# Patient Record
Sex: Female | Born: 1995 | Hispanic: Yes | Smoking: Never smoker
Health system: Southern US, Community
[De-identification: ages and names within clinical notes are randomized; demographics above are authoritative.]

## PROBLEM LIST (undated history)

## (undated) ENCOUNTER — Inpatient Hospital Stay (HOSPITAL_COMMUNITY): Payer: Self-pay

## (undated) DIAGNOSIS — O139 Gestational [pregnancy-induced] hypertension without significant proteinuria, unspecified trimester: Secondary | ICD-10-CM

## (undated) DIAGNOSIS — E7800 Pure hypercholesterolemia, unspecified: Secondary | ICD-10-CM

## (undated) DIAGNOSIS — N390 Urinary tract infection, site not specified: Secondary | ICD-10-CM

---

## 2020-05-29 ENCOUNTER — Other Ambulatory Visit: Payer: Self-pay

## 2020-05-29 ENCOUNTER — Inpatient Hospital Stay (HOSPITAL_COMMUNITY)
Admission: EM | Admit: 2020-05-29 | Discharge: 2020-05-30 | Disposition: A | Discharge status: Home / Self Care | Payer: Medicaid Other | Attending: Emergency Medicine | Admitting: Emergency Medicine

## 2020-05-29 DIAGNOSIS — O3680X0 Pregnancy with inconclusive fetal viability, not applicable or unspecified: Secondary | ICD-10-CM

## 2020-05-29 DIAGNOSIS — R109 Unspecified abdominal pain: Secondary | ICD-10-CM | POA: Diagnosis not present

## 2020-05-29 DIAGNOSIS — O26899 Other specified pregnancy related conditions, unspecified trimester: Secondary | ICD-10-CM

## 2020-05-29 DIAGNOSIS — O26891 Other specified pregnancy related conditions, first trimester: Secondary | ICD-10-CM | POA: Insufficient documentation

## 2020-05-29 DIAGNOSIS — Z6790 Unspecified blood type, Rh positive: Secondary | ICD-10-CM

## 2020-05-29 DIAGNOSIS — Z3A01 Less than 8 weeks gestation of pregnancy: Secondary | ICD-10-CM | POA: Insufficient documentation

## 2020-05-29 HISTORY — DX: Pure hypercholesterolemia, unspecified: E78.00

## 2020-05-29 HISTORY — DX: Gestational (pregnancy-induced) hypertension without significant proteinuria, unspecified trimester: O13.9

## 2020-05-29 HISTORY — DX: Urinary tract infection, site not specified: N39.0

## 2020-05-29 LAB — I-STAT BETA HCG BLOOD, ED (MC, WL, AP ONLY): I-stat hCG, quantitative: 613.3 m[IU]/mL — ABNORMAL HIGH (ref <5)

## 2020-05-29 NOTE — ED Provider Notes (Signed)
MSE was initiated and I personally evaluated the patient and placed orders (if any) at  10:33 PM on May 29, 2020.  The patient appears stable so that the remainder of the MSE may be completed by another provider.  G3P2 with positive home preg test here with 2 days worsening low abd pain. No bleeding. IStat HCG here 600s. Discussed with Joni Reining at [facility] who accepts patient in transfer. [facility]   Terrilee Files, MD 05/29/20 [phone]

## 2020-05-29 NOTE — MAU Note (Signed)
Pt c/o lower abdominal pain on and off since yesterday, some nausea with the pain. No bleeding.

## 2020-05-29 NOTE — ED Triage Notes (Signed)
Pt presents to ED POV. Pt c/o lower abd cramping. Pt taken home preg test was positive. Pt reports that LMP - 05/01/2020.

## 2020-05-30 ENCOUNTER — Inpatient Hospital Stay (HOSPITAL_COMMUNITY): Payer: Medicaid Other

## 2020-05-30 DIAGNOSIS — O3680X0 Pregnancy with inconclusive fetal viability, not applicable or unspecified: Secondary | ICD-10-CM

## 2020-05-30 LAB — COMPREHENSIVE METABOLIC PANEL
ALT: 16 U/L (ref 0–44)
AST: 18 U/L (ref 15–41)
Albumin: 4.1 g/dL (ref 3.5–5.0)
Alkaline Phosphatase: 66 U/L (ref 38–126)
Anion gap: 10 (ref 5–15)
BUN: 11 mg/dL (ref 6–20)
CO2: 22 mmol/L (ref 22–32)
Calcium: 9.2 mg/dL (ref 8.9–10.3)
Chloride: 106 mmol/L (ref 98–111)
Creatinine, Ser: 0.89 mg/dL (ref 0.44–1.00)
GFR, Estimated: >60 mL/min (ref >60)
Glucose, Bld: 102 mg/dL — ABNORMAL HIGH (ref 70–99)
Potassium: 3.5 mmol/L (ref 3.5–5.1)
Sodium: 138 mmol/L (ref 135–145)
Total Bilirubin: 1.1 mg/dL (ref 0.3–1.2)
Total Protein: 7.2 g/dL (ref 6.5–8.1)

## 2020-05-30 LAB — WET PREP, GENITAL
Clue Cells Wet Prep HPF POC: NONE SEEN
Sperm: NONE SEEN
Trich, Wet Prep: NONE SEEN
Yeast Wet Prep HPF POC: NONE SEEN

## 2020-05-30 LAB — CBC
HCT: 37.9 % (ref 36.0–46.0)
Hemoglobin: 13.7 g/dL (ref 12.0–15.0)
MCH: 32.5 pg (ref 26.0–34.0)
MCHC: 36.1 g/dL — ABNORMAL HIGH (ref 30.0–36.0)
MCV: 89.8 fL (ref 80.0–100.0)
Platelets: 249 10*3/uL (ref 150–400)
RBC: 4.22 MIL/uL (ref 3.87–5.11)
RDW: 12.6 % (ref 11.5–15.5)
WBC: 10.7 10*3/uL — ABNORMAL HIGH (ref 4.0–10.5)
nRBC: 0 % (ref 0.0–0.2)

## 2020-05-30 LAB — ABO/RH: ABO/RH(D): O POS

## 2020-05-30 LAB — URINALYSIS, ROUTINE W REFLEX MICROSCOPIC
Bilirubin Urine: NEGATIVE
Glucose, UA: NEGATIVE mg/dL
Hgb urine dipstick: NEGATIVE
Ketones, ur: 5 mg/dL — AB
Leukocytes,Ua: NEGATIVE
Nitrite: NEGATIVE
Protein, ur: NEGATIVE mg/dL
Specific Gravity, Urine: 1.031 — ABNORMAL HIGH (ref 1.005–1.030)
pH: 5 (ref 5.0–8.0)

## 2020-05-30 LAB — HCG, QUANTITATIVE, PREGNANCY: hCG, Beta Chain, Quant, S: 732 m[IU]/mL — ABNORMAL HIGH (ref <5)

## 2020-05-30 NOTE — Discharge Instructions (Signed)
Safe Medications in Pregnancy    Acne: Benzoyl Peroxide Salicylic Acid  Backache/Headache: Tylenol: 2 regular strength every 4 hours OR              2 Extra strength every 6 hours  Colds/Coughs/Allergies: Benadryl (alcohol free) 25 mg every 6 hours as needed Breath right strips Claritin Cepacol throat lozenges Chloraseptic throat spray Cold-Eeze- up to three times per day Cough drops, alcohol free Flonase (by prescription only) Guaifenesin Mucinex Robitussin DM (plain only, alcohol free) Saline nasal spray/drops Sudafed (pseudoephedrine) & Actifed ** use only after [redacted] weeks gestation and if you do not have high blood pressure Tylenol Vicks Vaporub Zinc lozenges Zyrtec   Constipation: Colace Ducolax suppositories Fleet enema Glycerin suppositories Metamucil Milk of magnesia Miralax Senokot Smooth move tea  Diarrhea: Kaopectate Imodium A-D  *NO pepto Bismol  Hemorrhoids: Anusol Anusol HC Preparation H Tucks  Indigestion: Tums Maalox Mylanta Zantac  Pepcid  Insomnia: Benadryl (alcohol free)  every 6 hours as needed Tylenol PM Unisom, no Gelcaps  Leg Cramps: Tums MagGel  Nausea/Vomiting:  Bonine Dramamine Emetrol Ginger extract Sea bands Meclizine  Nausea medication to take during pregnancy:  Unisom (doxylamine succinate 25 mg tablets) Take one tablet daily at bedtime. If symptoms are not adequately controlled, the dose can be increased to a maximum recommended dose of two tablets daily (1/2 tablet in the morning, 1/2 tablet mid-afternoon and one at bedtime). Vitamin B6  tablets. Take one tablet twice a day (up to 200 mg per day).  Skin Rashes: Aveeno products Benadryl cream or  every 6 hours as needed Calamine Lotion 1% cortisone cream  Yeast infection: Gyne-lotrimin 7 Monistat 7   **If taking multiple medications, please check labels to avoid duplicating the same active ingredients **take  medication as directed on the label ** Do not exceed 4000 mg of tylenol in 24 hours **Do not take medications that contain aspirin or ibuprofen          Prenatal Care Providers           [facility] @ [facility] - accepts patients without insurance  Phone: [phone]  [facility] [employer] @ [facility]   Phone: [phone]  [facility]  [place]       Phone: [phone]            [facility] @ [place]     Phone: [phone]          [facility] @ [employer]   Phone: [phone]  [facility] @ [facility] - accepts patients without insurance  Phone: [phone]  [facility] @ [facility] Phone: [phone]     [facility] - accepts patients without insurance Phone: [phone]  [facility]  Phone: [phone]  [facility] Phone: [phone]  [facility] Phone: [phone]  [facility] Phone: [phone]  [facility] Phone: [phone]  [facility]  Phone: [phone]         Ectopic Pregnancy  An ectopic pregnancy is when the fertilized egg attaches (implants) outside the uterus. Most ectopic pregnancies occur in one of the tubes where eggs travel from the ovary to the uterus (fallopian tubes), but the implanting can occur in other locations. In rare cases, ectopic pregnancies occur on the ovary, intestine, pelvis, abdomen, or cervix. In an ectopic pregnancy, the fertilized egg does not have the ability to develop into a normal, healthy baby.  A ruptured ectopic pregnancy is one in which tearing or bursting of a fallopian tube causes internal bleeding. Often, there is intense lower abdominal pain, and vaginal bleeding sometimes occurs. Having an ectopic pregnancy can be life-threatening. If this dangerous condition is not treated, it  can lead to blood loss, shock, or even death. What are the causes? The most common cause of this condition is damage to one of the fallopian tubes. A fallopian tube may be narrowed or blocked, and that keeps the fertilized egg from reaching the uterus. What increases the risk? This condition is more likely to develop in women of childbearing age who have different levels of risk. The levels of risk can be divided into three categories. High risk  You have gone through infertility treatment.  You have had an ectopic pregnancy before.  You have had surgery on the fallopian tubes, or another surgical procedure, such as an abortion.  You have had surgery to have the fallopian tubes tied (tubal ligation).  You have problems or diseases of the fallopian tubes.  You have been exposed to diethylstilbestrol (DES). This medicine was used until 1971, and it had effects on babies whose mothers took the medicine.  You become pregnant while using an IUD (intrauterine device) for birth control. Moderate risk  You have a history of infertility.  You have had an STI (sexually transmitted infection).  You have a history of pelvic inflammatory disease (PID).  You have scarring from endometriosis.  You have multiple sexual partners.  You smoke. Low risk  You have had pelvic surgery.  You use vaginal douches.  You became sexually active before age 61. What are the signs or symptoms? Common symptoms of this condition include normal pregnancy symptoms, such as missing a period, nausea, tiredness, abdominal pain, breast tenderness, and bleeding. However, ectopic pregnancy will have additional symptoms, such as:  Pain with intercourse.  Irregular vaginal bleeding or spotting.  Cramping or pain on one side or in the lower abdomen.  Fast heartbeat, low blood pressure, and sweating.  Passing out while having a bowel movement. Symptoms of a ruptured ectopic pregnancy and internal bleeding may  include:  Sudden, severe pain in the abdomen and pelvis.  Dizziness, weakness, light-headedness, or fainting.  Pain in the shoulder or neck area. How is this diagnosed? This condition is diagnosed by:  A pelvic exam to locate pain or a mass in the abdomen.  A pregnancy test. This blood test checks for the presence as well as the specific level of pregnancy hormone in the bloodstream.  Ultrasound. This is performed if a pregnancy test is positive. In this test, a probe is inserted into the vagina. The probe will detect a fetus, possibly in a location other than the uterus.  Taking a sample of uterus tissue (dilation and curettage, or D&C).  Surgery to perform a visual exam of the inside of the abdomen using a thin, lighted tube that has a tiny camera on the end (laparoscope).  Culdocentesis. This procedure involves inserting a needle at the top of the vagina, behind the uterus. If blood is present in this area, it may indicate that a fallopian tube is torn. How is this treated? This condition is treated with medicine or surgery. Medicine  An injection of a medicine (methotrexate) may be given to cause the pregnancy tissue to be absorbed. This medicine may save your fallopian tube. It may be given if: ? The diagnosis is made early, with no signs of active bleeding. ?  The fallopian tube has not ruptured. ? You are considered to be a good candidate for the medicine. Usually, pregnancy hormone blood levels are checked after methotrexate treatment. This is to be sure that the medicine is effective. It may take 4-6 weeks for the pregnancy to be absorbed. Most pregnancies will be absorbed by 3 weeks. Surgery  A laparoscope may be used to remove the pregnancy tissue.  If severe internal bleeding occurs, a larger cut (incision) may be made in the lower abdomen (laparotomy) to remove the fetus and placenta. This is done to stop the bleeding.  Part or all of the fallopian tube may be removed  (salpingectomy) along with the fetus and placenta. The fallopian tube may also be repaired during the surgery.  In very rare circumstances, removal of the uterus (hysterectomy) may be required.  After surgery, pregnancy hormone testing may be done to be sure that there is no pregnancy tissue left. Whether your treatment is medicine or surgery, you may receive a Rho (D) immune globulin shot to prevent problems with any future pregnancy. This shot may be given if:  You are Rh-negative and the baby's father is Rh-positive.  You are Rh-negative and you do not know the Rh type of the baby's father. Follow these instructions at home:  Rest and limit your activity after the procedure for as long as told by your health care provider.  Until your health care provider says that it is safe: ? Do not lift anything that is heavier than 10 lb (4.5 kg), or the limit that your health care provider tells you. ? Avoid physical exercise and any movement that requires effort (is strenuous).  To help prevent constipation: ? Eat a healthy diet that includes fruits, vegetables, and whole grains. ? Drink 6-8 glasses of water per day. Get help right away if:  You develop worsening pain that is not relieved by medicine.  You have: ? A fever or chills. ? Vaginal bleeding. ? Redness and swelling at the incision site. ? Nausea and vomiting.  You feel dizzy or weak.  You feel light-headed or you faint. This information is not intended to replace advice given to you by your health care provider. Make sure you discuss any questions you have with your health care provider. Document Revised: 05/12/2017 Document Reviewed: 12/30/2015 Elsevier Patient Education  2020 [employer]        Miscarriage A miscarriage is the loss of an unborn baby (fetus) before the 20th week of pregnancy. Most miscarriages happen during the first 3 months of pregnancy. Sometimes, a miscarriage can happen before a woman knows  that she is pregnant. Having a miscarriage can be an emotional experience. If you have had a miscarriage, talk with your health care provider about any questions you may have about miscarrying, the grieving process, and your plans for future pregnancy. What are the causes? A miscarriage may be caused by:  Problems with the genes or chromosomes of the fetus. These problems make it impossible for the baby to develop normally. They are often the result of random errors that occur early in the development of the baby, and are not passed from parent to child (not inherited).  Infection of the cervix or uterus.  Conditions that affect hormone balance in the body.  Problems with the cervix, such as the cervix opening and thinning before pregnancy is at term (cervical insufficiency).  Problems with the uterus. These may include: ? A uterus with an abnormal shape. ? Fibroids  in the uterus. ? Congenital abnormalities. These are problems that were present at birth.  Certain medical conditions.  Smoking, drinking alcohol, or using drugs.  Injury (trauma). In many cases, the cause of a miscarriage is not known. What are the signs or symptoms? Symptoms of this condition include:  Vaginal bleeding or spotting, with or without cramps or pain.  Pain or cramping in the abdomen or lower back.  Passing fluid, tissue, or blood clots from the vagina. How is this diagnosed? This condition may be diagnosed based on:  A physical exam.  Ultrasound.  Blood tests.  Urine tests. How is this treated? Treatment for a miscarriage is sometimes not necessary if you naturally pass all the tissue that was in your uterus. If necessary, this condition may be treated with:  Dilation and curettage (D&C). This is a procedure in which the cervix is stretched open and the lining of the uterus (endometrium) is scraped. This is done only if tissue from the fetus or placenta remains in the body (incomplete  miscarriage).  Medicines, such as: ? Antibiotic medicine, to treat infection. ? Medicine to help the body pass any remaining tissue. ? Medicine to reduce (contract) the size of the uterus. These medicines may be given if you have a lot of bleeding. If you have Rh negative blood and your baby was Rh positive, you will need a shot of a medicine called Rh immunoglobulinto protect your future babies from Rh blood problems. "Rh-negative" and "Rh-positive" refer to whether or not the blood has a specific protein found on the surface of red blood cells (Rh factor). Follow these instructions at home: Medicines   Take over-the-counter and prescription medicines only as told by your health care provider.  If you were prescribed antibiotic medicine, take it as told by your health care provider. Do not stop taking the antibiotic even if you start to feel better.  Do not take NSAIDs, such as aspirin and ibuprofen, unless they are approved by your health care provider. These medicines can cause bleeding. Activity  Rest as directed. Ask your health care provider what activities are safe for you.  Have someone help with home and family responsibilities during this time. General instructions  Keep track of the number of sanitary pads you use each day and how soaked (saturated) they are. Write down this information.  Monitor the amount of tissue or blood clots that you pass from your vagina. Save any large amounts of tissue for your health care provider to examine.  Do not use tampons, douche, or have sex until your health care provider approves.  To help you and your partner with the process of grieving, talk with your health care provider or seek counseling.  When you are ready, meet with your health care provider to discuss any important steps you should take for your health. Also, discuss steps you should take to have a healthy pregnancy in the future.  Keep all follow-up visits as told by your  health care provider. This is important. Where to find more information  The American Congress of Obstetricians and Gynecologists: www.acog.org  [place]. Department of Health and Cytogeneticist of Womens Health: http://hoffman.com/ Contact a health care provider if:  You have a fever or chills.  You have a foul smelling vaginal discharge.  You have more bleeding instead of less. Get help right away if:  You have severe cramps or pain in your back or abdomen.  You pass blood clots or tissue from  your vagina that is walnut-sized or larger.  You soak more than 1 regular sanitary pad in an hour.  You become light-headed or weak.  You pass out.  You have feelings of sadness that take over your thoughts, or you have thoughts of hurting yourself. Summary  Most miscarriages happen in the first 3 months of pregnancy. Sometimes miscarriage happens before a woman even knows that she is pregnant.  Follow your health care provider's instruction for home care. Keep all follow-up appointments.  To help you and your partner with the process of grieving, talk with your health care provider or seek counseling. This information is not intended to replace advice given to you by your health care provider. Make sure you discuss any questions you have with your health care provider. Document Revised: 09/21/2018 Document Reviewed: 07/05/2016 Elsevier Patient Education  2020 [employer].        First Trimester of Pregnancy The first trimester of pregnancy is from week 1 until the end of week 13 (months 1 through 3). A week after a sperm fertilizes an egg, the egg will implant on the wall of the uterus. This embryo will begin to develop into a baby. Genes from you and your partner will form the baby. The female genes will determine whether the baby will be a boy or a girl. At 6-8 weeks, the eyes and face will be formed, and the heartbeat can be seen on ultrasound. At the end of 12 weeks, all  the baby's organs will be formed. Now that you are pregnant, you will want to do everything you can to have a healthy baby. Two of the most important things are to get good prenatal care and to follow your health care provider's instructions. Prenatal care is all the medical care you receive before the baby's birth. This care will help prevent, find, and treat any problems during the pregnancy and childbirth. Body changes during your first trimester Your body goes through many changes during pregnancy. The changes vary from woman to woman.  You may gain or lose a couple of pounds at first.  You may feel sick to your stomach (nauseous) and you may throw up (vomit). If the vomiting is uncontrollable, call your health care provider.  You may tire easily.  You may develop headaches that can be relieved by medicines. All medicines should be approved by your health care provider.  You may urinate more often. Painful urination may mean you have a bladder infection.  You may develop heartburn as a result of your pregnancy.  You may develop constipation because certain hormones are causing the muscles that push stool through your intestines to slow down.  You may develop hemorrhoids or swollen veins (varicose veins).  Your breasts may begin to grow larger and become tender. Your nipples may stick out more, and the tissue that surrounds them (areola) may become darker.  Your gums may bleed and may be sensitive to brushing and flossing.  Dark spots or blotches (chloasma, mask of pregnancy) may develop on your face. This will likely fade after the baby is born.  Your menstrual periods will stop.  You may have a loss of appetite.  You may develop cravings for certain kinds of food.  You may have changes in your emotions from day to day, such as being excited to be pregnant or being concerned that something may go wrong with the pregnancy and baby.  You may have more vivid and strange  dreams.  You  may have changes in your hair. These can include thickening of your hair, rapid growth, and changes in texture. Some women also have hair loss during or after pregnancy, or hair that feels dry or thin. Your hair will most likely return to normal after your baby is born. What to expect at prenatal visits During a routine prenatal visit:  You will be weighed to make sure you and the baby are growing normally.  Your blood pressure will be taken.  Your abdomen will be measured to track your baby's growth.  The fetal heartbeat will be listened to between weeks 10 and 14 of your pregnancy.  Test results from any previous visits will be discussed. Your health care provider may ask you:  How you are feeling.  If you are feeling the baby move.  If you have had any abnormal symptoms, such as leaking fluid, bleeding, severe headaches, or abdominal cramping.  If you are using any tobacco products, including cigarettes, chewing tobacco, and electronic cigarettes.  If you have any questions. Other tests that may be performed during your first trimester include:  Blood tests to find your blood type and to check for the presence of any previous infections. The tests will also be used to check for low iron levels (anemia) and protein on red blood cells (Rh antibodies). Depending on your risk factors, or if you previously had diabetes during pregnancy, you may have tests to check for high blood sugar that affects pregnant women (gestational diabetes).  Urine tests to check for infections, diabetes, or protein in the urine.  An ultrasound to confirm the proper growth and development of the baby.  Fetal screens for spinal cord problems (spina bifida) and Down syndrome.  HIV (human immunodeficiency virus) testing. Routine prenatal testing includes screening for HIV, unless you choose not to have this test.  You may need other tests to make sure you and the baby are doing well. Follow  these instructions at home: Medicines  Follow your health care provider's instructions regarding medicine use. Specific medicines may be either safe or unsafe to take during pregnancy.  Take a prenatal vitamin that contains at least 600 micrograms (mcg) of folic acid.  If you develop constipation, try taking a stool softener if your health care provider approves. Eating and drinking   Eat a balanced diet that includes fresh fruits and vegetables, whole grains, good sources of protein such as meat, eggs, or tofu, and low-fat dairy. Your health care provider will help you determine the amount of weight gain that is right for you.  Avoid raw meat and uncooked cheese. These carry germs that can cause birth defects in the baby.  Eating four or five small meals rather than three large meals a day may help relieve nausea and vomiting. If you start to feel nauseous, eating a few soda crackers can be helpful. Drinking liquids between meals, instead of during meals, also seems to help ease nausea and vomiting.  Limit foods that are high in fat and processed sugars, such as fried and sweet foods.  To prevent constipation: ? Eat foods that are high in fiber, such as fresh fruits and vegetables, whole grains, and beans. ? Drink enough fluid to keep your urine clear or pale yellow. Activity  Exercise only as directed by your health care provider. Most women can continue their usual exercise routine during pregnancy. Try to exercise for 30 minutes at least 5 days a week. Exercising will help you: ? Control your weight. ?  Stay in shape. ? Be prepared for labor and delivery.  Experiencing pain or cramping in the lower abdomen or lower back is a good sign that you should stop exercising. Check with your health care provider before continuing with normal exercises.  Try to avoid standing for long periods of time. Move your legs often if you must stand in one place for a long time.  Avoid heavy  lifting.  Wear low-heeled shoes and practice good posture.  You may continue to have sex unless your health care provider tells you not to. Relieving pain and discomfort  Wear a good support bra to relieve breast tenderness.  Take warm sitz baths to soothe any pain or discomfort caused by hemorrhoids. Use hemorrhoid cream if your health care provider approves.  Rest with your legs elevated if you have leg cramps or low back pain.  If you develop varicose veins in your legs, wear support hose. Elevate your feet for 15 minutes, 3-4 times a day. Limit salt in your diet. Prenatal care  Schedule your prenatal visits by the twelfth week of pregnancy. They are usually scheduled monthly at first, then more often in the last 2 months before delivery.  Write down your questions. Take them to your prenatal visits.  Keep all your prenatal visits as told by your health care provider. This is important. Safety  Wear your seat belt at all times when driving.  Make a list of emergency phone numbers, including numbers for family, friends, the hospital, and police and fire departments. General instructions  Ask your health care provider for a referral to a local prenatal education class. Begin classes no later than the beginning of month 6 of your pregnancy.  Ask for help if you have counseling or nutritional needs during pregnancy. Your health care provider can offer advice or refer you to specialists for help with various needs.  Do not use hot tubs, steam rooms, or saunas.  Do not douche or use tampons or scented sanitary pads.  Do not cross your legs for long periods of time.  Avoid cat litter boxes and soil used by cats. These carry germs that can cause birth defects in the baby and possibly loss of the fetus by miscarriage or stillbirth.  Avoid all smoking, herbs, alcohol, and medicines not prescribed by your health care provider. Chemicals in these products affect the formation and  growth of the baby.  Do not use any products that contain nicotine or tobacco, such as cigarettes and e-cigarettes. If you need help quitting, ask your health care provider. You may receive counseling support and other resources to help you quit.  Schedule a dentist appointment. At home, brush your teeth with a soft toothbrush and be gentle when you floss. Contact a health care provider if:  You have dizziness.  You have mild pelvic cramps, pelvic pressure, or nagging pain in the abdominal area.  You have persistent nausea, vomiting, or diarrhea.  You have a bad smelling vaginal discharge.  You have pain when you urinate.  You notice increased swelling in your face, hands, legs, or ankles.  You are exposed to fifth disease or chickenpox.  You are exposed to [place] measles (rubella) and have never had it. Get help right away if:  You have a fever.  You are leaking fluid from your vagina.  You have spotting or bleeding from your vagina.  You have severe abdominal cramping or pain.  You have rapid weight gain or loss.  You vomit  blood or material that looks like coffee grounds.  You develop a severe headache.  You have shortness of breath.  You have any kind of trauma, such as from a fall or a car accident. Summary  The first trimester of pregnancy is from week 1 until the end of week 13 (months 1 through 3).  Your body goes through many changes during pregnancy. The changes vary from woman to woman.  You will have routine prenatal visits. During those visits, your health care provider will examine you, discuss any test results you may have, and talk with you about how you are feeling. This information is not intended to replace advice given to you by your health care provider. Make sure you discuss any questions you have with your health care provider. Document Revised: 05/12/2017 Document Reviewed: 05/11/2016 Elsevier Patient Education  2020 [employer].

## 2020-05-30 NOTE — MAU Provider Note (Signed)
History     CSN: [id number]  Arrival date and time: 05/29/20 2139   Event Date/Time   First Provider Initiated Contact with Patient 05/30/20 0101      Chief Complaint  Patient presents with  . Abdominal Pain   Ms. Joanna Parker is a 24 y.o. G3P2002 at [redacted]w[redacted]d who presents to MAU for abdominal cramping that is along the entire lower abdomen, feels like menstrual cramps, and began yesterday. Patient reports the pain is not currently present at this time.  OB History    Gravida  3   Para  2   Term  2   Preterm      AB      Living  2     SAB      IAB      Ectopic      Multiple      Live Births  2           Past Medical History:  Diagnosis Date  . Hypercholesteremia   . Pregnancy induced hypertension   . UTI (urinary tract infection)     Past Surgical History:  Procedure Laterality Date  . NO PAST SURGERIES      No family history on file.  Social History   Tobacco Use  . Smoking status: Never Smoker  . Smokeless tobacco: Never Used  Vaping Use  . Vaping Use: Never used  Substance Use Topics  . Alcohol use: Not Currently  . Drug use: Not Currently    Allergies: No Known Allergies  No medications prior to admission.    Review of Systems  Constitutional: Negative for chills, diaphoresis, fatigue and fever.  Eyes: Negative for visual disturbance.  Respiratory: Negative for shortness of breath.   Cardiovascular: Negative for chest pain.  Gastrointestinal: Positive for abdominal pain. Negative for constipation, diarrhea, nausea and vomiting.  Genitourinary: Negative for dysuria, flank pain, frequency, pelvic pain, urgency, vaginal bleeding and vaginal discharge.  Neurological: Negative for dizziness, weakness, light-headedness and headaches.   Physical Exam   Blood pressure 109/66, pulse 79, temperature 98.2 F (36.8 C), temperature source Oral, resp. rate 18, weight 63.8 kg, last menstrual period 05/01/2020, SpO2 100 %.  Patient Vitals  for the past 24 hrs:  BP Temp Temp src Pulse Resp SpO2 Weight  05/29/20 2345 -- -- -- -- -- -- 63.8 kg  05/29/20 2330 109/66 98.2 F (36.8 C) Oral 79 18 -- --  05/29/20 2329 -- -- -- -- -- 100 % --  05/29/20 2153 125/72 98.3 F (36.8 C) Oral 91 16 100 % --   Physical Exam Constitutional:      General: She is not in acute distress.    Appearance: She is well-developed and well-nourished. She is not diaphoretic.  HENT:     Head: Normocephalic and atraumatic.  Pulmonary:     Effort: Pulmonary effort is normal.  Abdominal:     General: There is no distension.     Palpations: Abdomen is soft. There is no mass.     Tenderness: There is no abdominal tenderness. There is no guarding or rebound.  Skin:    General: Skin is warm and dry.  Neurological:     Mental Status: She is alert and oriented to person, place, and time.  Psychiatric:        Mood and Affect: Mood and affect normal.        Behavior: Behavior normal.        Thought Content: Thought content normal.  Judgment: Judgment normal.    Results for orders placed or performed during the hospital encounter of 05/29/20 (from the past 24 hour(s))  I-Stat Beta hCG blood, ED ([facility], [facility], [facility] only)     Status: Abnormal   Collection Time: 05/29/20 10:12 PM  Result Value Ref Range   I-stat hCG, quantitative 613.3 (H) <5 mIU/mL   Comment 3          Wet prep, genital     Status: Abnormal   Collection Time: 05/29/20 11:49 PM   Specimen: PATH Cytology Cervicovaginal Ancillary Only  Result Value Ref Range   Yeast Wet Prep HPF POC NONE SEEN NONE SEEN   Trich, Wet Prep NONE SEEN NONE SEEN   Clue Cells Wet Prep HPF POC NONE SEEN NONE SEEN   WBC, Wet Prep HPF POC FEW (A) NONE SEEN   Sperm NONE SEEN   CBC     Status: Abnormal   Collection Time: 05/30/20 12:00 AM  Result Value Ref Range   WBC 10.7 (H) 4.0 - 10.5 K/uL   RBC 4.22 3.87 - 5.11 MIL/uL   Hemoglobin 13.7 12.0 - 15.0 g/dL   HCT 56.3 14.9 - 70.2 %   MCV 89.8 80.0 - 100.0 fL    MCH 32.5 26.0 - 34.0 pg   MCHC 36.1 (H) 30.0 - 36.0 g/dL   RDW 63.7 85.8 - 85.0 %   Platelets 249 150 - 400 K/uL   nRBC 0.0 0.0 - 0.2 %  Comprehensive metabolic panel     Status: Abnormal   Collection Time: 05/30/20 12:00 AM  Result Value Ref Range   Sodium 138 135 - 145 mmol/L   Potassium 3.5 3.5 - 5.1 mmol/L   Chloride 106 98 - 111 mmol/L   CO2 22 22 - 32 mmol/L   Glucose, Bld 102 (H) 70 - 99 mg/dL   BUN 11 6 - 20 mg/dL   Creatinine, Ser 2.77 0.44 - 1.00 mg/dL   Calcium 9.2 8.9 - 41.2 mg/dL   Total Protein 7.2 6.5 - 8.1 g/dL   Albumin 4.1 3.5 - 5.0 g/dL   AST 18 15 - 41 U/L   ALT 16 0 - 44 U/L   Alkaline Phosphatase 66 38 - 126 U/L   Total Bilirubin 1.1 0.3 - 1.2 mg/dL   GFR, Estimated >87 >86 mL/min   Anion gap 10 5 - 15  hCG, quantitative, pregnancy     Status: Abnormal   Collection Time: 05/30/20 12:00 AM  Result Value Ref Range   hCG, Beta Chain, Quant, S 732 (H) <5 mIU/mL  ABO/Rh     Status: None   Collection Time: 05/30/20 12:00 AM  Result Value Ref Range   ABO/RH(D) O POS    No rh immune globuloin      NOT A RH IMMUNE GLOBULIN CANDIDATE, PT RH POSITIVE Performed at [facility], [address] [place], [place] [address]   Urinalysis, Routine w reflex microscopic Urine, Clean Catch     Status: Abnormal   Collection Time: 05/30/20  1:13 AM  Result Value Ref Range   Color, Urine YELLOW YELLOW   APPearance CLEAR CLEAR   Specific Gravity, Urine 1.031 (H) 1.005 - 1.030   pH 5.0 5.0 - 8.0   Glucose, UA NEGATIVE NEGATIVE mg/dL   Hgb urine dipstick NEGATIVE NEGATIVE   Bilirubin Urine NEGATIVE NEGATIVE   Ketones, ur 5 (A) NEGATIVE mg/dL   Protein, ur NEGATIVE NEGATIVE mg/dL   Nitrite NEGATIVE NEGATIVE  Leukocytes,Ua NEGATIVE NEGATIVE   US OB LESS THAN 14 WEEKS WITH OB TRANSVAGINAL  Result Date: 05/30/2020 CLINICAL DATA:  Pain since yesterday. EXAM: OBSTETRIC <14 WK [place] AND TRANSVAGINAL OB US TECHNIQUE: Both transabdominal and transvaginal ultrasound  examinations were performed for complete evaluation of the gestation as well as the maternal uterus, adnexal regions, and pelvic cul-de-sac. Transvaginal technique was performed to assess early pregnancy. COMPARISON:  None. FINDINGS: Intrauterine gestational sac: None Yolk sac:  Not Visualized. Embryo:  Not Visualized. Cardiac Activity: Not Visualized. Heart Rate: N/A  bpm Subchorionic hemorrhage:  None visualized. Maternal uterus/adnexae: The uterus is retroverted and otherwise normal in appearance (no ultrasonographic measurements provided). The right ovary measures 1.5 cm x 2.9 cm x 2.1 cm and is normal in appearance. The left ovary measures 3.3 cm x 2.5 cm x 2.2 cm and contains a isodense area that demonstrates surrounding increased flow on color Doppler evaluation. A trace amount of fluid is seen within the left adnexa. IMPRESSION: 1. No evidence of an intrauterine pregnancy. 2. Additional findings within the left ovary which may represent a corpus luteum cyst. Correlation with follow-up pelvic ultrasound and serial beta HCG levels is recommended, as a left-sided ectopic pregnancy cannot be excluded. Electronically Signed   By: Aram Candela M.D.   On: 05/30/2020 00:51    MAU Course  Procedures  MDM -r/o ectopic -UA: SG1.031/5ketones, sending urine for culture based on symptoms -CBC: WNL for pregnancy -CMP: WNL -[place]: PUL, ectopic pregnancy cannot be excluded, per Dr. Debroah Loop, pt OK to be discharged home with routine follow-up for PUL -hCG: 732 -ABO: O Positive -WetPrep: WNL -GC/CT collected Discussed with client the diagnosis of pregnancy of unknown anatomic location.  Three possibilities of outcome are: a healthy pregnancy that is too early to see a yolk sac to confirm the pregnancy is in the uterus, a pregnancy that is not healthy and has not developed and will not develop, and an ectopic pregnancy that is in the abdomen that cannot be identified at this time.  And ectopic pregnancy can be a  life threatening situation as a pregnancy needs to be in the uterus which is a muscle and can stretch to accommodate the growth of a pregnancy.  Other structures in the pelvis and abdomen as not muscular and do not stretch with the growth of a pregnancy.  Worst case scenario is that a structure ruptures with a growing pregnancy not in the uterus and and internal hemorrhage can be a life threatening situation.  We need to follow the progression of this pregnancy carefully.  We need to check another serum pregnancy hormone level to determine if the levels are rising appropriately  and to determine the next steps that are needed for you. Patient's questions were answered. -pt discharged to home in stable condition  Orders Placed This Encounter  Procedures  . Wet prep, genital    Standing Status:   Standing    Number of Occurrences:   1  . Culture, OB Urine    Standing Status:   Standing    Number of Occurrences:   1  . US OB LESS THAN 14 WEEKS WITH OB TRANSVAGINAL    Standing Status:   Standing    Number of Occurrences:   1    Order Specific Question:   Symptom/Reason for Exam    Answer:   Abdominal pain in pregnancy [[id number]]  . CBC    Standing Status:   Standing    Number of Occurrences:  1  . Comprehensive metabolic panel    Standing Status:   Standing    Number of Occurrences:   1  . hCG, quantitative, pregnancy    Standing Status:   Standing    Number of Occurrences:   1  . Urinalysis, Routine w reflex microscopic    Standing Status:   Standing    Number of Occurrences:   1  . I-Stat Beta hCG blood, ED ([facility], [facility], [facility] only)    Standing Status:   Standing    Number of Occurrences:   1  . ABO/Rh    Standing Status:   Standing    Number of Occurrences:   1  . Discharge patient    Order Specific Question:   Discharge disposition    Answer:   01-Home or Self Care [1]    Order Specific Question:   Discharge patient date    Answer:   05/30/2020   No orders of the defined types were  placed in this encounter.   Assessment and Plan   1. Pregnancy of unknown anatomic location   2. Abdominal pain in pregnancy   3. Blood type, Rh positive     Allergies as of 05/30/2020   No Known Allergies     Medication List    TAKE these medications   multivitamin-prenatal 27-0.8 MG Tabs tablet Take 1 tablet by mouth daily at 12 noon.       -will call with culture results, if positive -safe meds in pregnancy list given -list of OB providers given -discussed ectopic vs. SAB vs. miscarriage -strict ectopic precautions given -return MAU precautions -[facility] -pt discharged to home in stable condition  Joni Reiningicole E Albertus Chiarelli 05/30/2020, 2:03 AM

## 2020-06-01 ENCOUNTER — Other Ambulatory Visit: Payer: Self-pay

## 2020-06-01 LAB — GC/CHLAMYDIA PROBE AMP (~~LOC~~) NOT AT ARMC
Chlamydia: NEGATIVE
Comment: NEGATIVE
Comment: NORMAL
Neisseria Gonorrhea: NEGATIVE

## 2020-06-01 LAB — CULTURE, OB URINE: Culture: 30000 — AB

## 2020-06-02 ENCOUNTER — Telehealth: Payer: Self-pay | Admitting: Advanced Practice Midwife

## 2020-06-02 NOTE — Telephone Encounter (Signed)
Attempted to call patient to discuss urine culture results. Voicemail not set up. Will call again later today  Clayton Bibles, MSN, CNM Certified Nurse Midwife, [facility] for [employer], [facility] 06/02/20 8:18 AM

## 2020-06-10 ENCOUNTER — Other Ambulatory Visit (INDEPENDENT_AMBULATORY_CARE_PROVIDER_SITE_OTHER): Payer: Self-pay | Admitting: Obstetrics and Gynecology

## 2020-06-10 DIAGNOSIS — O2341 Unspecified infection of urinary tract in pregnancy, first trimester: Secondary | ICD-10-CM

## 2020-06-10 MED ORDER — CEFADROXIL 500 MG PO CAPS: 500.0000 mg | ORAL_CAPSULE | Freq: Two times a day (BID) | ORAL | 0 refills | Status: AC

## 2020-06-10 NOTE — Progress Notes (Signed)
TC to notify patient of (+) UCx -- Rx sent for Cefadroxil 500 mg BID x 10 days. Pharmacy verified. Patient verbalized an understanding of the plan of care and agrees.   Raelyn Mora, CNM  06/10/2020 12:15 PM

## 2020-07-15 ENCOUNTER — Inpatient Hospital Stay (HOSPITAL_COMMUNITY)
Admission: AD | Admit: 2020-07-15 | Discharge: 2020-07-15 | Disposition: A | Discharge status: Home / Self Care | Payer: Medicaid Other | Attending: Obstetrics and Gynecology | Admitting: Obstetrics and Gynecology

## 2020-07-15 ENCOUNTER — Other Ambulatory Visit: Payer: Self-pay

## 2020-07-15 ENCOUNTER — Inpatient Hospital Stay (HOSPITAL_COMMUNITY): Payer: Medicaid Other

## 2020-07-15 DIAGNOSIS — O3680X0 Pregnancy with inconclusive fetal viability, not applicable or unspecified: Secondary | ICD-10-CM | POA: Diagnosis present

## 2020-07-15 DIAGNOSIS — Z3A09 9 weeks gestation of pregnancy: Secondary | ICD-10-CM | POA: Insufficient documentation

## 2020-07-15 DIAGNOSIS — O021 Missed abortion: Secondary | ICD-10-CM | POA: Diagnosis not present

## 2020-07-15 LAB — CBC
HCT: 37.5 % (ref 36.0–46.0)
Hemoglobin: 13 g/dL (ref 12.0–15.0)
MCH: 31.3 pg (ref 26.0–34.0)
MCHC: 34.7 g/dL (ref 30.0–36.0)
MCV: 90.4 fL (ref 80.0–100.0)
Platelets: 220 10*3/uL (ref 150–400)
RBC: 4.15 MIL/uL (ref 3.87–5.11)
RDW: 12.8 % (ref 11.5–15.5)
WBC: 7.8 10*3/uL (ref 4.0–10.5)
nRBC: 0 % (ref 0.0–0.2)

## 2020-07-15 LAB — HCG, QUANTITATIVE, PREGNANCY: hCG, Beta Chain, Quant, S: 17167 m[IU]/mL — ABNORMAL HIGH (ref <5)

## 2020-07-15 LAB — ABO/RH: ABO/RH(D): O POS

## 2020-07-15 LAB — POCT PREGNANCY, URINE: Preg Test, Ur: POSITIVE — AB

## 2020-07-15 NOTE — Discharge Instructions (Signed)
Miscarriage A miscarriage is the loss of pregnancy before the 20th week. Most miscarriages happen during the first 3 months of pregnancy. Sometimes, a miscarriage can happen before a woman knows that she is pregnant. Having a miscarriage can be an emotional experience. If you have had a miscarriage, talk with your health care provider about any questions you may have about the loss of your baby, the grieving process, and your plans for future pregnancy. What are the causes? Many times, the cause of a miscarriage is not known. What increases the risk? The following factors may make a pregnant woman more likely to have a miscarriage: Certain medical conditions  Conditions that affect the hormone balance in the body, such as thyroid disease or polycystic ovary syndrome.  Diabetes.  Autoimmune disorders.  Infections.  Bleeding disorders.  Obesity. Lifestyle factors  Using products with tobacco or nicotine in them or being exposed to tobacco smoke.  Having alcohol.  Having large amounts of caffeine.  Recreational drug use. Problems with reproductive organs or structures  Cervical insufficiency. This is when the lowest part of the uterus (cervix) opens and thins before pregnancy is at term.  Having a condition called Asherman syndrome. This syndrome causes scarring in the uterus or causes the uterus to be abnormal in structure.  Fibrous growths, called fibroids, in the uterus.  Congenital abnormalities. These problems are present at birth.  Infection of the cervix or uterus. Personal or medical history  Injury (trauma).  Having had a miscarriage before.  Being younger than age 18 or older than age 35.  Exposure to harmful substances in the environment. This may include radiation or heavy metals, such as lead.  Use of certain medicines. What are the signs or symptoms? Symptoms of this condition include:  Vaginal bleeding or spotting, with or without cramps or  pain.  Pain or cramping in the abdomen or lower back.  Fluid or tissue coming out of the vagina. How is this diagnosed? This condition may be diagnosed based on:  A physical exam.  Ultrasound.  Lab tests, such as blood tests, urine tests, or swabs for infection. How is this treated? Treatment for a miscarriage is sometimes not needed if all the pregnancy tissue that was in the uterus comes out on its own, and there are no other problems such as infection or heavy bleeding. In other cases, this condition may be treated with:  Dilation and curettage (D&C). In this procedure, the cervix is stretched open and any remaining pregnancy tissue is removed from the lining of the uterus (endometrium).  Medicines. These may include: ? Antibiotic medicine, to treat infection. ? Medicine to help any remaining pregnancy tissue come out of the body. ? Medicine to reduce (contract) the size of the uterus. These medicines may be given if there is a lot of bleeding. If you have Rh-negative blood, you may be given an injection of a medicine called Rho(D) immune globulin. This medicine helps prevent problems with future pregnancies. Follow these instructions at home: Medicines  Take over-the-counter and prescription medicines only as told by your health care provider.  If you were prescribed antibiotic medicine, take it as told by your health care provider. Do not stop taking the antibiotic even if you start to feel better. Activity  Rest as told by your health care provider. Ask your health care provider what activities are safe for you.  Have someone help with home and family responsibilities during this time. General instructions  Monitor how much tissue   or blood clot material comes out of the vagina.  Do not have sex, douche, or put anything, such as tampons, in your vagina until your health care provider says it is okay.  To help you and your partner with the grieving process, talk with your  health care provider or get counseling.  When you are ready, meet with your health care provider to discuss any important steps you should take for your health. Also, discuss steps you should take to have a healthy pregnancy in the future.  Keep all follow-up visits. This is important.   Where to find more information  The American College of Obstetricians and Gynecologists: acog.org  [place].S. Department of Health and Human Services Office of Women's Health: hrsa.gov/office-womens-health Contact a health care provider if:  You have a fever or chills.  There is bad-smelling fluid coming from the vagina.  You have more bleeding instead of less.  Tissue or blood clots come out of your vagina. Get help right away if:  You have severe cramps or pain in your back or abdomen.  Heavy bleeding soaks through 2 large sanitary pads an hour for more than 2 hours.  You become light-headed or weak.  You faint.  You feel sad, and your sadness takes over your thoughts.  You think about hurting yourself. If you ever feel like you may hurt yourself or others, or have thoughts about taking your own life, get help right away. Go to your nearest emergency department or:  Call your local emergency services (911 in the [place]  Call a suicide crisis helpline, such as the National Suicide Prevention Lifeline at 1-800-273-8255. This is open 24 hours a day in the [place]  Text the Crisis Text Line at 741741 (in the [place] Summary  Most miscarriages happen in the first 3 months of pregnancy. Sometimes miscarriage happens before a woman knows that she is pregnant.  Follow instructions from your health care provider about medicines and activity.  To help you and your partner with grieving, talk with your health care provider or get counseling.  Keep all follow-up visits. This information is not intended to replace advice given to you by your health care provider. Make sure you discuss any questions you  have with your health care provider. Document Revised: 11/29/2019 Document Reviewed: 11/29/2019 Elsevier Patient Education  2021 Elsevier Inc.  

## 2020-07-15 NOTE — MAU Note (Signed)
Joanna Parker is a 25 y.o. at [redacted]w[redacted]d here in MAU reporting: was sent over from [facility], states they did an u/s and they did not see fetal heart tones. They told her to come here for follow up. Denies pain and bleeding.  Onset of complaint: today  Pain score: 0/10  Vitals:   07/15/20 1154  BP: 118/63  Pulse: 85  Resp: 18  Temp: 98.5 F (36.9 C)  SpO2: 100%     Lab orders placed from triage: none

## 2020-07-15 NOTE — MAU Provider Note (Signed)
History     CSN: [id number]  Arrival date and time: 07/15/20 1138   Event Date/Time   First Provider Initiated Contact with Patient 07/15/20 1201      Chief Complaint  Patient presents with  . Fetal Heart Tones   HPI Joanna Parker is a 25 y.o. G3P2002 at [redacted]w[redacted]d by LMP who presents for evaluation of pregnancy. Was seen in MAU in December; diagnosed with pregnancy of unknown location but didn't follow up. She states she didn't follow up because of insurance issues; went to an office today and was told they didn't see the baby on ultrasound. Denies abdominal pain or vaginal bleeding. States no bleeding episode since her MAU visit in December.   OB History    Gravida  3   Para  2   Term  2   Preterm      AB      Living  2     SAB      IAB      Ectopic      Multiple      Live Births  2           Past Medical History:  Diagnosis Date  . Hypercholesteremia   . Pregnancy induced hypertension   . UTI (urinary tract infection)     Past Surgical History:  Procedure Laterality Date  . NO PAST SURGERIES      No family history on file.  Social History   Tobacco Use  . Smoking status: Never Smoker  . Smokeless tobacco: Never Used  Vaping Use  . Vaping Use: Never used  Substance Use Topics  . Alcohol use: Not Currently  . Drug use: Not Currently    Allergies: No Known Allergies  No medications prior to admission.    Review of Systems  Constitutional: Negative.   Gastrointestinal: Negative.   Genitourinary: Negative.    Physical Exam   Blood pressure 118/63, pulse 85, temperature 98.5 F (36.9 C), temperature source Oral, resp. rate 18, height 5' (1.524 m), weight 62.4 kg, last menstrual period 05/01/2020, SpO2 100 %.  Physical Exam Vitals and nursing note reviewed.  Constitutional:      General: She is not in acute distress.    Appearance: Normal appearance.  HENT:     Head: Normocephalic and atraumatic.  Neurological:     Mental Status:  She is alert.  Psychiatric:        Mood and Affect: Mood normal.        Behavior: Behavior normal.     MAU Course  Procedures Results for orders placed or performed during the hospital encounter of 07/15/20 (from the past 24 hour(s))  Pregnancy, urine POC     Status: Abnormal   Collection Time: 07/15/20 12:05 PM  Result Value Ref Range   Preg Test, Ur POSITIVE (A) NEGATIVE  CBC     Status: None   Collection Time: 07/15/20 12:26 PM  Result Value Ref Range   WBC 7.8 4.0 - 10.5 K/uL   RBC 4.15 3.87 - 5.11 MIL/uL   Hemoglobin 13.0 12.0 - 15.0 g/dL   HCT 24.0 97.3 - 53.2 %   MCV 90.4 80.0 - 100.0 fL   MCH 31.3 26.0 - 34.0 pg   MCHC 34.7 30.0 - 36.0 g/dL   RDW 99.2 42.6 - 83.4 %   Platelets 220 150 - 400 K/uL   nRBC 0.0 0.0 - 0.2 %  ABO/Rh     Status: None   Collection Time:  07/15/20 12:26 PM  Result Value Ref Range   ABO/RH(D) O POS    No rh immune globuloin      NOT A RH IMMUNE GLOBULIN CANDIDATE, PT RH POSITIVE Performed at [facility], [address] [place], [place] [address]   hCG, quantitative, pregnancy     Status: Abnormal   Collection Time: 07/15/20 12:26 PM  Result Value Ref Range   hCG, Beta Chain, Quant, S 17,167 (H) <5 mIU/mL   US OB Comp Less 14 Wks  Result Date: 07/15/2020 CLINICAL DATA:  Suspected intrauterine fetal demise.  LMP 02/05/2021 EXAM: OBSTETRIC <14 WK ULTRASOUND TECHNIQUE: Transabdominal ultrasound was performed for evaluation of the gestation as well as the maternal uterus and adnexal regions. COMPARISON:  05/30/2020 FINDINGS: Intrauterine gestational sac: Present, single Yolk sac: Present, single, abnormal however with irregular shape and hypoechogenic, thickened wall. Embryo:  Present, single Cardiac Activity: Not identified Heart Rate: 0 bpm MSD: Appropriate given fetal size CRL:   27 mm   9 w in 4 d                  [place] EDC: 02/13/2021 Subchorionic hemorrhage:  None visualized. Maternal uterus/adnexae: The uterus is anteverted. No uterine  masses are identified. The cervix is closed and is unremarkable. No free fluid within the cul-de-sac. The maternal ovaries are normal in size and echogenicity. IMPRESSION: Intrauterine fetal demise. Electronically Signed   By: Helyn Numbers MD   On: 07/15/2020 13:10    MDM UPT today is positive so will proceed with labs & ultrasound.  Ultrasound shows IUP measuring 107w4d with no cardiac activity.  RH positive  Discussed options for management of incomplete AB including expectant management, Cytotec or D&C. Patient prefers D&C. She has previously used an ob/gyn in [place] but would like to f/u with our practice.   Assessment and Plan   1. Missed abortion with fetal demise before 20 completed weeks of gestation   2. Pregnancy of unknown anatomic location   3. [redacted] weeks gestation of pregnancy    -msg sent to [place] to schedule D&C -reviewed reasons to return to MAU  Judeth Horn 07/15/2020, 2:27 PM

## 2020-07-17 ENCOUNTER — Other Ambulatory Visit: Payer: Self-pay

## 2020-07-17 ENCOUNTER — Encounter (HOSPITAL_BASED_OUTPATIENT_CLINIC_OR_DEPARTMENT_OTHER): Payer: Self-pay | Admitting: Obstetrics and Gynecology

## 2020-07-18 ENCOUNTER — Other Ambulatory Visit: Payer: Self-pay | Admitting: Obstetrics and Gynecology

## 2020-07-18 ENCOUNTER — Other Ambulatory Visit (HOSPITAL_COMMUNITY): Payer: Medicaid Other

## 2020-07-20 ENCOUNTER — Other Ambulatory Visit (HOSPITAL_COMMUNITY)
Admission: RE | Admit: 2020-07-20 | Discharge: 2020-07-20 | Disposition: A | Discharge status: Home / Self Care | Payer: Medicaid Other | Source: Ambulatory Visit | Attending: Obstetrics and Gynecology | Admitting: Obstetrics and Gynecology

## 2020-07-20 DIAGNOSIS — Z20822 Contact with and (suspected) exposure to covid-19: Secondary | ICD-10-CM | POA: Diagnosis not present

## 2020-07-20 DIAGNOSIS — Z01812 Encounter for preprocedural laboratory examination: Secondary | ICD-10-CM | POA: Diagnosis present

## 2020-07-20 LAB — SARS CORONAVIRUS 2 (TAT 6-24 HRS): SARS Coronavirus 2: NEGATIVE

## 2020-07-22 ENCOUNTER — Encounter (HOSPITAL_BASED_OUTPATIENT_CLINIC_OR_DEPARTMENT_OTHER)
Admission: RE | Disposition: A | Discharge status: Home / Self Care | Payer: Self-pay | Source: Home / Self Care | Attending: Obstetrics and Gynecology

## 2020-07-22 ENCOUNTER — Ambulatory Visit (HOSPITAL_BASED_OUTPATIENT_CLINIC_OR_DEPARTMENT_OTHER)
Admission: RE | Admit: 2020-07-22 | Discharge: 2020-07-22 | Disposition: A | Discharge status: Home / Self Care | Payer: Medicaid Other | Attending: Obstetrics and Gynecology | Admitting: Obstetrics and Gynecology

## 2020-07-22 ENCOUNTER — Other Ambulatory Visit: Payer: Self-pay

## 2020-07-22 ENCOUNTER — Encounter: Payer: Self-pay | Admitting: Obstetrics and Gynecology

## 2020-07-22 ENCOUNTER — Ambulatory Visit (HOSPITAL_BASED_OUTPATIENT_CLINIC_OR_DEPARTMENT_OTHER): Payer: Medicaid Other | Admitting: Anesthesiology

## 2020-07-22 ENCOUNTER — Encounter (HOSPITAL_BASED_OUTPATIENT_CLINIC_OR_DEPARTMENT_OTHER): Payer: Self-pay | Admitting: Obstetrics and Gynecology

## 2020-07-22 DIAGNOSIS — Z3A09 9 weeks gestation of pregnancy: Secondary | ICD-10-CM | POA: Insufficient documentation

## 2020-07-22 DIAGNOSIS — O021 Missed abortion: Secondary | ICD-10-CM | POA: Insufficient documentation

## 2020-07-22 DIAGNOSIS — Z9889 Other specified postprocedural states: Secondary | ICD-10-CM

## 2020-07-22 DIAGNOSIS — Z79899 Other long term (current) drug therapy: Secondary | ICD-10-CM | POA: Diagnosis not present

## 2020-07-22 HISTORY — PX: DILATION AND EVACUATION: SHX1459

## 2020-07-22 LAB — POCT PREGNANCY, URINE: Preg Test, Ur: POSITIVE — AB

## 2020-07-22 SURGERY — DILATION AND EVACUATION, UTERUS
Anesthesia: General | Site: Uterus

## 2020-07-22 MED ORDER — ONDANSETRON HCL 4 MG/2ML IJ SOLN: 4.0000 mg | Freq: Once | INTRAMUSCULAR | Status: DC | PRN

## 2020-07-22 MED ORDER — PROPOFOL 10 MG/ML IV BOLUS
INTRAVENOUS | Status: DC | PRN
  Administered 2020-07-22: 150 mg via INTRAVENOUS

## 2020-07-22 MED ORDER — KETOROLAC TROMETHAMINE 30 MG/ML IJ SOLN
INTRAMUSCULAR | Status: DC | PRN
  Administered 2020-07-22: 30 mg via INTRAVENOUS

## 2020-07-22 MED ORDER — SODIUM CHLORIDE 0.9 % IV SOLN
INTRAVENOUS | Status: AC
  Filled 2020-07-22: qty 100

## 2020-07-22 MED ORDER — FENTANYL CITRATE (PF) 100 MCG/2ML IJ SOLN
25.0000 ug | INTRAMUSCULAR | Status: DC | PRN
  Administered 2020-07-22 (×2): 50 ug via INTRAVENOUS

## 2020-07-22 MED ORDER — DEXAMETHASONE SODIUM PHOSPHATE 10 MG/ML IJ SOLN
INTRAMUSCULAR | Status: AC
  Filled 2020-07-22: qty 1

## 2020-07-22 MED ORDER — DEXAMETHASONE SODIUM PHOSPHATE 10 MG/ML IJ SOLN
INTRAMUSCULAR | Status: DC | PRN
  Administered 2020-07-22: 10 mg via INTRAVENOUS

## 2020-07-22 MED ORDER — ONDANSETRON HCL 4 MG/2ML IJ SOLN
INTRAMUSCULAR | Status: DC | PRN
  Administered 2020-07-22: 4 mg via INTRAVENOUS

## 2020-07-22 MED ORDER — ACETAMINOPHEN 10 MG/ML IV SOLN
INTRAVENOUS | Status: AC
  Filled 2020-07-22: qty 100

## 2020-07-22 MED ORDER — FENTANYL CITRATE (PF) 100 MCG/2ML IJ SOLN
INTRAMUSCULAR | Status: AC
  Filled 2020-07-22: qty 2

## 2020-07-22 MED ORDER — FENTANYL CITRATE (PF) 100 MCG/2ML IJ SOLN
INTRAMUSCULAR | Status: DC | PRN
  Administered 2020-07-22: 50 ug via INTRAVENOUS

## 2020-07-22 MED ORDER — POVIDONE-IODINE 10 % EX SWAB
2.0000 "application " | Freq: Once | CUTANEOUS | Status: AC
  Administered 2020-07-22: 2 via TOPICAL

## 2020-07-22 MED ORDER — CARBOPROST TROMETHAMINE 250 MCG/ML IM SOLN
INTRAMUSCULAR | Status: AC
  Filled 2020-07-22: qty 1

## 2020-07-22 MED ORDER — MIDAZOLAM HCL 2 MG/2ML IJ SOLN
INTRAMUSCULAR | Status: AC
  Filled 2020-07-22: qty 2

## 2020-07-22 MED ORDER — OXYTOCIN 10 UNIT/ML IJ SOLN
INTRAMUSCULAR | Status: AC
  Filled 2020-07-22: qty 1

## 2020-07-22 MED ORDER — IBUPROFEN 600 MG PO TABS: 600.0000 mg | ORAL_TABLET | Freq: Four times a day (QID) | ORAL | 3 refills | Status: DC | PRN

## 2020-07-22 MED ORDER — MIDAZOLAM HCL 2 MG/2ML IJ SOLN
INTRAMUSCULAR | Status: DC | PRN
  Administered 2020-07-22: 2 mg via INTRAVENOUS

## 2020-07-22 MED ORDER — LIDOCAINE HCL (PF) 1 % IJ SOLN
INTRAMUSCULAR | Status: AC
  Filled 2020-07-22: qty 30

## 2020-07-22 MED ORDER — ACETAMINOPHEN 10 MG/ML IV SOLN
1000.0000 mg | Freq: Once | INTRAVENOUS | Status: AC
  Administered 2020-07-22: 1000 mg via INTRAVENOUS

## 2020-07-22 MED ORDER — LIDOCAINE 2% (20 MG/ML) 5 ML SYRINGE
INTRAMUSCULAR | Status: AC
  Filled 2020-07-22: qty 5

## 2020-07-22 MED ORDER — LACTATED RINGERS IV SOLN: INTRAVENOUS | Status: DC

## 2020-07-22 MED ORDER — SODIUM CHLORIDE 0.9 % IV SOLN
100.0000 mg | Freq: Once | INTRAVENOUS | Status: AC
  Administered 2020-07-22: 100 mg via INTRAVENOUS
  Filled 2020-07-22: qty 100

## 2020-07-22 MED ORDER — PROPOFOL 10 MG/ML IV BOLUS
INTRAVENOUS | Status: AC
  Filled 2020-07-22: qty 20

## 2020-07-22 MED ORDER — OXYCODONE HCL 5 MG/5ML PO SOLN: 5.0000 mg | Freq: Once | ORAL | Status: DC | PRN

## 2020-07-22 MED ORDER — METHYLERGONOVINE MALEATE 0.2 MG/ML IJ SOLN
INTRAMUSCULAR | Status: AC
  Filled 2020-07-22: qty 1

## 2020-07-22 MED ORDER — ONDANSETRON HCL 4 MG/2ML IJ SOLN
INTRAMUSCULAR | Status: AC
  Filled 2020-07-22: qty 2

## 2020-07-22 MED ORDER — MISOPROSTOL 200 MCG PO TABS
ORAL_TABLET | ORAL | Status: AC
  Filled 2020-07-22: qty 5

## 2020-07-22 MED ORDER — LIDOCAINE 2% (20 MG/ML) 5 ML SYRINGE
INTRAMUSCULAR | Status: DC | PRN
  Administered 2020-07-22: 50 mg via INTRAVENOUS

## 2020-07-22 MED ORDER — OXYCODONE HCL 5 MG PO TABS: 5.0000 mg | ORAL_TABLET | Freq: Once | ORAL | Status: DC | PRN

## 2020-07-22 MED ORDER — SILVER NITRATE-POT NITRATE 75-25 % EX MISC
CUTANEOUS | Status: DC | PRN
  Administered 2020-07-22: 1 via TOPICAL

## 2020-07-22 MED ORDER — LIDOCAINE HCL 1 % IJ SOLN
INTRAMUSCULAR | Status: DC | PRN
  Administered 2020-07-22: 20 mL

## 2020-07-22 SURGICAL SUPPLY — 24 items
CATH ROBINSON RED A/P 14FR (CATHETERS) IMPLANT
COVER PROBE 5X48 (MISCELLANEOUS)
GAUZE 4X4 16PLY RFD (DISPOSABLE) ×2 IMPLANT
GLOVE SURG POLYISO LF SZ7 (GLOVE) ×2 IMPLANT
GLOVE SURG UNDER POLY LF SZ7 (GLOVE) ×2 IMPLANT
GLOVE SURG UNDER POLY LF SZ7.5 (GLOVE) ×2 IMPLANT
GOWN STRL REUS W/ TWL LRG LVL3 (GOWN DISPOSABLE) ×1 IMPLANT
GOWN STRL REUS W/TWL LRG LVL3 (GOWN DISPOSABLE) ×2
GOWN STRL REUS W/TWL XL LVL3 (GOWN DISPOSABLE) ×2 IMPLANT
HOSE CONNECTING 18IN BERKELEY (TUBING) IMPLANT
KIT BERKELEY 1ST TRI 3/8 NO TR (MISCELLANEOUS) ×2 IMPLANT
KIT BERKELEY 1ST TRIMESTER 3/8 (MISCELLANEOUS) ×2 IMPLANT
KIT CVR 48X5XPRB PLUP LF (MISCELLANEOUS) IMPLANT
NS IRRIG 1000ML POUR BTL (IV SOLUTION) ×2 IMPLANT
PACK VAGINAL MINOR WOMEN LF (CUSTOM PROCEDURE TRAY) ×2 IMPLANT
PAD OB MATERNITY 4.3X12.25 (PERSONAL CARE ITEMS) ×2 IMPLANT
PAD PREP 24X48 CUFFED NSTRL (MISCELLANEOUS) ×2 IMPLANT
SET BERKELEY SUCTION TUBING (SUCTIONS) ×2 IMPLANT
SLEEVE SCD COMPRESS KNEE MED (MISCELLANEOUS) ×2 IMPLANT
TOWEL GREEN STERILE FF (TOWEL DISPOSABLE) ×2 IMPLANT
VACURETTE 10 RIGID CVD (CANNULA) ×2 IMPLANT
VACURETTE 7MM CVD STRL WRAP (CANNULA) IMPLANT
VACURETTE 8 RIGID CVD (CANNULA) IMPLANT
VACURETTE 9 RIGID CVD (CANNULA) IMPLANT

## 2020-07-22 NOTE — Anesthesia Procedure Notes (Signed)
Procedure Name: LMA Insertion Date/Time: 07/22/2020 11:45 AM Performed by: [place], Catalea Labrecque C, CRNA Pre-anesthesia Checklist: Patient identified, Emergency Drugs available, Suction available and Patient being monitored Patient Re-evaluated:Patient Re-evaluated prior to induction Oxygen Delivery Method: Circle system utilized Preoxygenation: Pre-oxygenation with 100% oxygen Induction Type: IV induction Ventilation: Mask ventilation without difficulty LMA: LMA inserted LMA Size: 4.0 Number of attempts: 1 Airway Equipment and Method: Bite block Placement Confirmation: positive ETCO2 Tube secured with: Tape Dental Injury: Teeth and Oropharynx as per pre-operative assessment

## 2020-07-22 NOTE — Op Note (Signed)
Operative Note   07/22/2020  PRE-OP DIAGNOSIS: Missed abortion at 9 weeks by crown rump length   POST-OP DIAGNOSIS: Same  SURGEON: Surgeon(s) and Role:    Ettrick Bing, MD - Primary  ASSISTANT: None  PROCEDURE:  Suction dilation and curettage  ANESTHESIA: General and paracervical block  ESTIMATED BLOOD LOSS: 60mL  DRAINS: None  TOTAL IV FLUIDS: per anesthesia not  SPECIMENS: products of conception to pathology  VTE PROPHYLAXIS: SCDs to the bilateral lower extremities  ANTIBIOTICS: Doxycycline 100mg  IV x 1 pre op  COMPLICATIONS: none  DISPOSITION: PACU - hemodynamically stable.  CONDITION: stable  BLOOD TYPE: O POS. Rhogam given:not applicable  FINDINGS: Exam under anesthesia revealed 8-10 week sized uterus with no masses and bilateral adnexa without masses or fullness. Necrotic appearing products of conception were seen, with gritty texture in all four quadrants.  PROCEDURE IN DETAIL:  After informed consent was obtained, the patient was taken to the operating room where anesthesia was obtained without difficulty. The patient was positioned in the dorsal lithotomy position in [place] stirrups. The patient was examined under anesthesia, with the above noted findings.  The bi-valved speculum was placed inside the patient's vagina, and the the anterior lip of the cervix was seen and grasped with the tenaculum.  A paracervical block was achieved with 74mL of 1% lidocaine and then the cervix was progressively dilated to a 29 French-Pratt dilator.  The suction was then calibrated to 30m and connected to the number 10 cannula, which was then introduced with the above noted findings. A gentle curettage was done at the end and yield no products of conception.   The suction was then done one more time to remove any remaining curettage material.   Excellent hemostasis was noted, and all instruments were removed, with excellent hemostasis noted throughout.  She was then taken out of  dorsal lithotomy. The patient tolerated the procedure well.  Sponge, lap and instrument counts were correct x2.  The patient was taken to recovery room in excellent condition.  MD Attending [facility] [employer])

## 2020-07-22 NOTE — H&P (Signed)
Obstetrics & Gynecology Surgical H&P   Date of Surgery: 07/22/2020    Primary OBGYN: [facility]  Reason for Admission: scheduled d&c for missed abortion  History of Present Illness: Joanna Parker is a 25 y.o. [phone] (Patient's last menstrual period was 05/01/2020.), with the above CC. PMHx is significant for nothing.  Patient dx with IUFD on 2/2  At 9wks based on CRL.  Currently she has no s/s of miscarriage except for slight cramping and brown d/c; she denies any heavy VB since being dx with an IUFD  ROS: as stated in the above HPI.  OBGYN History: As per HPI. OB History  Gravida Para Term Preterm AB Living  3 2 2     2   SAB IAB Ectopic Multiple Live Births          2    # Outcome Date GA Lbr Len/2nd Weight Sex Delivery Anes PTL Lv  3 Current           2 Term      Vag-Spont   LIV  1 Term      Vag-Spont   LIV     Past Medical History: Past Medical History:  Diagnosis Date  . Hypercholesteremia   . Pregnancy induced hypertension   . UTI (urinary tract infection)     Past Surgical History: Past Surgical History:  Procedure Laterality Date  . NO PAST SURGERIES      Family History:  History reviewed. No pertinent family history.  Social History:  Social History   Socioeconomic History  . Marital status: Unknown    Spouse name: Not on file  . Number of children: Not on file  . Years of education: Not on file  . Highest education level: Not on file  Occupational History  . Not on file  Tobacco Use  . Smoking status: Never Smoker  . Smokeless tobacco: Never Used  Vaping Use  . Vaping Use: Never used  Substance and Sexual Activity  . Alcohol use: Not Currently  . Drug use: Not Currently  . Sexual activity: Yes    Birth control/protection: None  Other Topics Concern  . Not on file  Social History Narrative  . Not on file   Social Determinants of Health   Financial Resource Strain: Not on file  Food Insecurity: Not on file   Transportation Needs: Not on file  Physical Activity: Not on file  Stress: Not on file  Social Connections: Not on file  Intimate Partner Violence: Not on file    Allergy: No Known Allergies  Current Outpatient Medications: Medications Prior to Admission  Medication Sig Dispense Refill Last Dose  . acetaminophen (TYLENOL) 500 MG tablet Take 500 mg by mouth every 6 (six) hours as needed.   07/16/2020 at Unknown time  . Prenatal Vit-Fe Fumarate-FA (MULTIVITAMIN-PRENATAL) 27-0.8 MG TABS tablet Take 1 tablet by mouth daily at 12 noon.   Past Month at Unknown time     Hospital Medications: Current Facility-Administered Medications  Medication Dose Route Frequency Provider Last Rate Last Admin  . doxycycline (VIBRAMYCIN) 100 mg in sodium chloride 0.9 % 250 mL IVPB  100 mg Intravenous Once 09/13/2020, MD      . lactated ringers infusion   Intravenous Continuous [place] Bing, MD      . lactated ringers infusion   Intravenous Continuous Lewie Loron, MD      . povidone-iodine 10 % swab 2 application  2 application Topical Once [place] Bing, MD  Physical Exam:  Current Vital Signs 24h Vital Sign Ranges  T   No data recorded  BP   No data recorded  HR   No data recorded  RR   No data recorded  SaO2     No data recorded       24 Hour I/O Current Shift I/O  Time Ins Outs No intake/output data recorded. No intake/output data recorded.    Body mass index is 26.87 kg/m. General appearance: Well nourished, well developed female in no acute distress.  Cardiovascular: S1, S2 normal, no murmur, rub or gallop, regular rate and rhythm Respiratory:  Clear to auscultation bilateral. Normal respiratory effort Abdomen: positive bowel sounds and no masses, hernias; diffusely non tender to palpation, non distended Neuro/Psych:  Normal mood and affect.  Skin:  Warm and dry.  Extremities: no clubbing, cyanosis, or edema.  Lymphatic:  No inguinal lymphadenopathy.    Laboratory: COVID: neg Recent Labs  Lab 07/15/20 1226  WBC 7.8  HGB 13.0  HCT 37.5  PLT 220   No results for input(s): NA, K, CL, CO2, BUN, CREATININE, CALCIUM, PROT, BILITOT, ALKPHOS, ALT, AST, GLUCOSE in the last 168 hours.  Invalid input(s): LABALBU No results for input(s): APTT, INR, PTT in the last 168 hours.  Invalid input(s): DRHAPTT Recent Labs  Lab 07/15/20 1226  ABORH O POS    Imaging:  Narrative & Impression  CLINICAL DATA:  Suspected intrauterine fetal demise.  LMP 02/05/2021  EXAM: OBSTETRIC <14 WK ULTRASOUND  TECHNIQUE: Transabdominal ultrasound was performed for evaluation of the gestation as well as the maternal uterus and adnexal regions.  COMPARISON:  05/30/2020  FINDINGS: Intrauterine gestational sac: Present, single  Yolk sac: Present, single, abnormal however with irregular shape and hypoechogenic, thickened wall.  Embryo:  Present, single  Cardiac Activity: Not identified  Heart Rate: 0 bpm  MSD: Appropriate given fetal size  CRL:   27 mm   9 w in 4 d                  [place] EDC: 02/13/2021  Subchorionic hemorrhage:  None visualized.  Maternal uterus/adnexae: The uterus is anteverted. No uterine masses are identified. The cervix is closed and is unremarkable. No free fluid within the cul-de-sac. The maternal ovaries are normal in size and echogenicity.  IMPRESSION: Intrauterine fetal demise.   Electronically Signed   By: Helyn Numbers MD   On: 07/15/2020 13:10    Assessment: Joanna Parker is a 25 y.o. [id number] (Patient's last menstrual period was 05/01/2020.) here for suction d&c.  Plan: Patient consented and can proceed to OR when ready.    Cornelia Copa MD Attending [facility] [employer] (Faculty Practice) [phone]

## 2020-07-22 NOTE — Anesthesia Preprocedure Evaluation (Signed)
Anesthesia Evaluation  Patient identified by MRN, date of birth, ID band Patient awake    Reviewed: Allergy & Precautions, NPO status , Patient's Chart, lab work & pertinent test results  Airway Mallampati: II  TM Distance: >3 FB Neck ROM: Full    Dental no notable dental hx. (+) Teeth Intact   Pulmonary neg pulmonary ROS,    Pulmonary exam normal breath sounds clear to auscultation       Cardiovascular hypertension, Normal cardiovascular exam Rhythm:Regular Rate:Normal  Hx/o PIH with prior pregnancy   Neuro/Psych negative neurological ROS  negative psych ROS   GI/Hepatic negative GI ROS, Neg liver ROS,   Endo/Other  negative endocrine ROS  Renal/GU negative Renal ROS  negative genitourinary   Musculoskeletal negative musculoskeletal ROS (+)   Abdominal   Peds  Hematology negative hematology ROS (+)   Anesthesia Other Findings   Reproductive/Obstetrics (+) Pregnancy 10 5/7 weeks  Missed Ab                             Anesthesia Physical Anesthesia Plan  ASA: II  Anesthesia Plan: General   Post-op Pain Management:    Induction: Intravenous  PONV Risk Score and Plan: 4 or greater and Midazolam, Treatment may vary due to age or medical condition and Ondansetron  Airway Management Planned: LMA  Additional Equipment:   Intra-op Plan:   Post-operative Plan: Extubation in OR  Informed Consent: I have reviewed the patients History and Physical, chart, labs and discussed the procedure including the risks, benefits and alternatives for the proposed anesthesia with the patient or authorized representative who has indicated his/her understanding and acceptance.     Dental advisory given  Plan Discussed with: CRNA and Anesthesiologist  Anesthesia Plan Comments:         Anesthesia Quick Evaluation

## 2020-07-22 NOTE — Discharge Instructions (Addendum)
 We will discuss your surgery once again in detail at your post-op visit in two to four weeks. If you haven't already done so, please call to make your appointment as soon as possible.  Dilation and Curettage or Vacuum Curettage, Care After These instructions give you information on caring for yourself after your procedure. Your doctor may also give you more specific instructions. Call your doctor if you have any problems or questions after your procedure. HOME CARE  Do not drive for 24 hours.  Wait 1 week before doing any activities that wear you out.  Do not stand for a long time.  Limit stair climbing to once or twice a day.  Rest often.  Continue with your usual diet.  Drink enough fluids to keep your pee (urine) clear or pale yellow.  If you have a hard time pooping (constipation), you may:  Take a medicine to help you go poop (laxative) as told by your doctor.  Eat more fruit and bran.  Drink more fluids.  Take showers, not baths, for as long as told by your doctor.  Do not swim or use a hot tub until your doctor says it is okay.  Have someone with you for 1day after the procedure.  Do not douche, use tampons, or have sex (intercourse) until seen by your doctor  Only take medicines as told by your doctor. Do not take aspirin. It can cause bleeding.  Keep all doctor visits. GET HELP IF:  You have cramps or pain not helped by medicine.  You have new pain in the belly (abdomen).  You have a bad smelling fluid coming from your vagina.  You have a rash.  You have problems with any medicine. GET HELP RIGHT AWAY IF:   You start to bleed more than a regular period.  You have a fever.  You have chest pain.  You have trouble breathing.  You feel dizzy or feel like passing out (fainting).  You pass out.  You have pain in the tops of your shoulders.  You have vaginal bleeding with or without clumps of blood (blood clots). MAKE SURE YOU:  Understand  these instructions.  Will watch your condition.  Will get help right away if you are not doing well or get worse. Document Released: 03/08/2008 Document Revised: 06/04/2013 Document Reviewed: 12/27/2012 ExitCare Patient Information 2015 [employer]. This information is not intended to replace advice given to you by your health care provider. Make sure you discuss any questions you have with your health care provider.    Post Anesthesia Home Care Instructions  Activity: Get plenty of rest for the remainder of the day. A responsible individual must stay with you for 24 hours following the procedure.  For the next 24 hours, DO NOT: -Drive a car -Operate machinery -Drink alcoholic beverages -Take any medication unless instructed by your physician -Make any legal decisions or sign important papers.  Meals: Start with liquid foods such as gelatin or soup. Progress to regular foods as tolerated. Avoid greasy, spicy, heavy foods. If nausea and/or vomiting occur, drink only clear liquids until the nausea and/or vomiting subsides. Call your physician if vomiting continues.  Special Instructions/Symptoms: Your throat may feel dry or sore from the anesthesia or the breathing tube placed in your throat during surgery. If this causes discomfort, gargle with warm salt water. The discomfort should disappear within 24 hours.  If you had a scopolamine patch placed behind your ear for the management of post- operative   nausea and/or vomiting:  1. The medication in the patch is effective for 72 hours, after which it should be removed.  Wrap patch in a tissue and discard in the trash. Wash hands thoroughly with soap and water. 2. You may remove the patch earlier than 72 hours if you experience unpleasant side effects which may include dry mouth, dizziness or visual disturbances. 3. Avoid touching the patch. Wash your hands with soap and water after contact with the patch.      

## 2020-07-22 NOTE — Brief Op Note (Signed)
07/22/2020  12:06 PM  PATIENT:  Joanna Parker  25 y.o. female  PRE-OPERATIVE DIAGNOSIS:  Missed AB  POST-OPERATIVE DIAGNOSIS:  Same  PROCEDURE:  Procedure(s): DILATATION AND EVACUATION (N/A)  SURGEON:  Surgeon(s) and Role:    * [place] Bing, MD - Primary  PHYSICIAN ASSISTANT:   ASSISTANTS: none   ANESTHESIA:   local and general  EBL:  41mL  BLOOD ADMINISTERED:none  DRAINS: none   LOCAL MEDICATIONS USED:  LIDOCAINE   SPECIMEN:  Products of conception  DISPOSITION OF SPECIMEN:  PATHOLOGY  COUNTS:  YES  TOURNIQUET:  * No tourniquets in log *  DICTATION: .Note written in EPIC  PLAN OF CARE: Discharge to home after PACU  PATIENT DISPOSITION:  PACU - hemodynamically stable.   Delay start of Pharmacological VTE agent (>24hrs) due to surgical blood loss or risk of bleeding: not applicable

## 2020-07-22 NOTE — Transfer of Care (Signed)
Immediate Anesthesia Transfer of Care Note  Patient: Mckell Riecke  Procedure(s) Performed: DILATATION AND EVACUATION (N/A )  Patient Location: PACU  Anesthesia Type:General  Level of Consciousness: awake and drowsy  Airway & Oxygen Therapy: Patient Spontanous Breathing and Patient connected to face mask oxygen  Post-op Assessment: Report given to RN and Post -op Vital signs reviewed and stable  Post vital signs: Reviewed and stable  Last Vitals:  Vitals Value Taken Time  BP 125/80 07/22/20 1215  Temp    Pulse 86 07/22/20 1220  Resp 23 07/22/20 1220  SpO2 100 % 07/22/20 1220  Vitals shown include unvalidated device data.  Last Pain:  Vitals:   07/22/20 1010  TempSrc: Oral  PainSc: 3       Patients Stated Pain Goal: 4 (07/22/20 1010)  Complications: No complications documented.

## 2020-07-22 NOTE — Anesthesia Postprocedure Evaluation (Signed)
Anesthesia Post Note  Patient: Lylith Bebeau  Procedure(s) Performed: DILATATION AND EVACUATION (N/A Uterus)     Patient location during evaluation: PACU Anesthesia Type: General Level of consciousness: awake and alert and oriented Pain management: pain level controlled Vital Signs Assessment: post-procedure vital signs reviewed and stable Respiratory status: spontaneous breathing, nonlabored ventilation and respiratory function stable Cardiovascular status: blood pressure returned to baseline and stable Postop Assessment: no apparent nausea or vomiting Anesthetic complications: no   No complications documented.  Last Vitals:  Vitals:   07/22/20 1010 07/22/20 1215  BP: 107/62 125/80  Pulse: 88 88  Resp: 18 15  Temp: 36.9 C 36.7 C  SpO2: 100% 100%    Last Pain:  Vitals:   07/22/20 1301  TempSrc:   PainSc: 3                  Willy Vorce A.

## 2020-07-23 ENCOUNTER — Encounter (HOSPITAL_BASED_OUTPATIENT_CLINIC_OR_DEPARTMENT_OTHER): Payer: Self-pay | Admitting: Obstetrics and Gynecology

## 2020-07-23 LAB — SURGICAL PATHOLOGY

## 2020-08-12 ENCOUNTER — Ambulatory Visit (INDEPENDENT_AMBULATORY_CARE_PROVIDER_SITE_OTHER): Payer: Medicaid Other | Admitting: Obstetrics and Gynecology

## 2020-08-12 ENCOUNTER — Encounter: Payer: Self-pay | Admitting: Obstetrics and Gynecology

## 2020-08-12 ENCOUNTER — Other Ambulatory Visit: Payer: Self-pay

## 2020-08-12 VITALS — BP 112/72 | HR 77 | Ht 60.0 in | Wt 141.2 lb

## 2020-08-12 DIAGNOSIS — Z09 Encounter for follow-up examination after completed treatment for conditions other than malignant neoplasm: Secondary | ICD-10-CM

## 2020-08-12 MED ORDER — NORETHIN ACE-ETH ESTRAD-FE 1-20 MG-MCG(24) PO TABS: 1.0000 | ORAL_TABLET | Freq: Every day | ORAL | 3 refills | Status: DC

## 2020-08-12 NOTE — Progress Notes (Signed)
Pt wants Birth Control Pills 

## 2020-08-12 NOTE — Progress Notes (Signed)
Obstetrics and Gynecology Visit Return Patient Evaluation  Appointment Date: 08/12/2020  Primary Care Provider: Pcp, No  OBGYN Clinic: [facility]  Chief Complaint: f/u d&c  History of Present Illness:  Joanna Parker is a 25 y.o. [id number] s/p 2/9 d&c for 9wk missed AB.    Interval History: Since that time, she states that she is doing well and not having any VB or issues. No period since surgery  Review of Systems: as noted in the History of Present Illness.  Medications: None  Allergies: has No Known Allergies.  Physical Exam:  BP 112/72   Pulse 77   Ht 5' (1.524 m)   Wt 141 lb 3.2 oz (64 kg)   LMP 05/01/2020 Comment: SAB  Breastfeeding Unknown   BMI 27.58 kg/m  Body mass index is 27.58 kg/m. General appearance: Well nourished, well developed female in no acute distress.  Neuro/Psych:  Normal mood and affect.     Assessment: pt doing well  Plan:  1. Postop check She would like to start birth control. R/b/a d/w her and she would like to do OCPs; no prior OCP history. No sexual intercourse yet. Loestrin sent in and pt advised to wait a week before considering them effective   RTC: PRN  Cornelia Copa MD Attending [facility] [employer] [facility])

## 2021-12-11 ENCOUNTER — Emergency Department (HOSPITAL_COMMUNITY): Payer: Medicaid Other

## 2021-12-11 ENCOUNTER — Emergency Department (HOSPITAL_COMMUNITY)
Admission: EM | Admit: 2021-12-11 | Discharge: 2021-12-11 | Disposition: A | Discharge status: Home / Self Care | Payer: Medicaid Other | Attending: Emergency Medicine | Admitting: Emergency Medicine

## 2021-12-11 ENCOUNTER — Other Ambulatory Visit: Payer: Self-pay

## 2021-12-11 DIAGNOSIS — S8991XA Unspecified injury of right lower leg, initial encounter: Secondary | ICD-10-CM | POA: Insufficient documentation

## 2021-12-11 DIAGNOSIS — S0990XA Unspecified injury of head, initial encounter: Secondary | ICD-10-CM | POA: Insufficient documentation

## 2021-12-11 DIAGNOSIS — Y92410 Unspecified street and highway as the place of occurrence of the external cause: Secondary | ICD-10-CM | POA: Insufficient documentation

## 2021-12-11 LAB — COMPREHENSIVE METABOLIC PANEL
ALT: 10 U/L (ref 0–44)
AST: 17 U/L (ref 15–41)
Albumin: 3.7 g/dL (ref 3.5–5.0)
Alkaline Phosphatase: 50 U/L (ref 38–126)
Anion gap: 15 (ref 5–15)
BUN: 12 mg/dL (ref 6–20)
CO2: 19 mmol/L — ABNORMAL LOW (ref 22–32)
Calcium: 9.1 mg/dL (ref 8.9–10.3)
Chloride: 107 mmol/L (ref 98–111)
Creatinine, Ser: 1.01 mg/dL — ABNORMAL HIGH (ref 0.44–1.00)
GFR, Estimated: >60 mL/min (ref >60)
Glucose, Bld: 115 mg/dL — ABNORMAL HIGH (ref 70–99)
Potassium: 3.5 mmol/L (ref 3.5–5.1)
Sodium: 141 mmol/L (ref 135–145)
Total Bilirubin: 0.7 mg/dL (ref 0.3–1.2)
Total Protein: 7.2 g/dL (ref 6.5–8.1)

## 2021-12-11 LAB — SAMPLE TO BLOOD BANK

## 2021-12-11 LAB — I-STAT CHEM 8, ED
BUN: 12 mg/dL (ref 6–20)
Calcium, Ion: 1.08 mmol/L — ABNORMAL LOW (ref 1.15–1.40)
Chloride: 108 mmol/L (ref 98–111)
Creatinine, Ser: 0.9 mg/dL (ref 0.44–1.00)
Glucose, Bld: 117 mg/dL — ABNORMAL HIGH (ref 70–99)
HCT: 39 % (ref 36.0–46.0)
Hemoglobin: 13.3 g/dL (ref 12.0–15.0)
Potassium: 3.5 mmol/L (ref 3.5–5.1)
Sodium: 141 mmol/L (ref 135–145)
TCO2: 21 mmol/L — ABNORMAL LOW (ref 22–32)

## 2021-12-11 LAB — CBC
HCT: 39.3 % (ref 36.0–46.0)
Hemoglobin: 14.5 g/dL (ref 12.0–15.0)
MCH: 32.8 pg (ref 26.0–34.0)
MCHC: 36.9 g/dL — ABNORMAL HIGH (ref 30.0–36.0)
MCV: 88.9 fL (ref 80.0–100.0)
Platelets: 227 10*3/uL (ref 150–400)
RBC: 4.42 MIL/uL (ref 3.87–5.11)
RDW: 11.9 % (ref 11.5–15.5)
WBC: 8.5 10*3/uL (ref 4.0–10.5)
nRBC: 0 % (ref 0.0–0.2)

## 2021-12-11 LAB — PROTIME-INR
INR: 1.1 (ref 0.8–1.2)
Prothrombin Time: 14 seconds (ref 11.4–15.2)

## 2021-12-11 LAB — I-STAT BETA HCG BLOOD, ED (MC, WL, AP ONLY): I-stat hCG, quantitative: <5 m[IU]/mL (ref <5)

## 2021-12-11 LAB — ETHANOL: Alcohol, Ethyl (B): <10 mg/dL (ref <10)

## 2021-12-11 MED ORDER — OXYCODONE-ACETAMINOPHEN 5-325 MG PO TABS: 1.0000 | ORAL_TABLET | Freq: Once | ORAL | Status: DC

## 2021-12-11 MED ORDER — HYDROMORPHONE HCL 1 MG/ML IJ SOLN
0.5000 mg | Freq: Once | INTRAMUSCULAR | Status: AC
  Administered 2021-12-11: 0.5 mg via INTRAVENOUS
  Filled 2021-12-11: qty 1

## 2021-12-11 MED ORDER — ACETAMINOPHEN 325 MG PO TABS: 650.0000 mg | ORAL_TABLET | Freq: Four times a day (QID) | ORAL | 0 refills | Status: DC | PRN

## 2021-12-11 MED ORDER — CYCLOBENZAPRINE HCL 10 MG PO TABS: 10.0000 mg | ORAL_TABLET | Freq: Two times a day (BID) | ORAL | 0 refills | Status: DC | PRN

## 2021-12-11 MED ORDER — IBUPROFEN 600 MG PO TABS: 600.0000 mg | ORAL_TABLET | Freq: Four times a day (QID) | ORAL | 0 refills | Status: DC | PRN

## 2021-12-11 MED ORDER — IOHEXOL 300 MG/ML  SOLN
80.0000 mL | Freq: Once | INTRAMUSCULAR | Status: AC | PRN
  Administered 2021-12-11: 80 mL via INTRAVENOUS
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
## 2021-12-11 NOTE — Progress Notes (Signed)
Orthopedic Tech Progress Note Patient Details:  Osceola Holian May 21, 1996 [id number]  Ortho Devices Type of Ortho Device: Crutches, Knee Immobilizer Ortho Device/Splint Location: rle Ortho Device/Splint Interventions: Ordered, Application, Adjustment   Post Interventions Patient Tolerated: Well Instructions Provided: Care of device, Adjustment of device  Trinna Post 12/11/2021, 11:12 PM

## 2021-12-11 NOTE — ED Notes (Signed)
Got patient on the monitor did manual blood pressure patient is resting with nursing at bedside
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
## 2021-12-11 NOTE — Progress Notes (Signed)
Chaplain engaged in an initial visit with Joanna Parker.  Joanna Parker wanted Chaplain to call her boyfriend.  Chaplain was able to reach him and he let Chaplain know he was coming to hospital as soon as he could.   Chaplain offered support, community, and presence to Joanna Parker.     12/11/21 1848  Clinical Encounter Type  Visited With Patient  Visit Type Initial;Spiritual support  Advance Directives (For Healthcare)  Does Patient Have a Medical Advance Directive? No  Mental Health Advance Directives  Does Patient Have a Mental Health Advance Directive? No

## 2021-12-12 NOTE — ED Notes (Signed)
!  mg Dilaudid pulled from pyxis, 0.5mg  to be wasted. Waste not being shown as an option in pyxis, 0.5mg  Dilaudid wasted. RN [place] M RN witnessed at this time.

## 2022-10-21 IMAGING — US US OB COMP LESS 14 WK
1 series · 15 of 28 positions shown · non-contrast
Comparison: 05/30/2020

CLINICAL DATA: Suspected intrauterine fetal demise.  LMP 02/05/2021

EXAM:
OBSTETRIC <14 WK ULTRASOUND
TECHNIQUE: Transabdominal ultrasound was performed for evaluation of the
gestation as well as the maternal uterus and adnexal regions.

[Series 1: us ob comp less 14 wk · 15 of 41 slices shown]
[im 1/41]
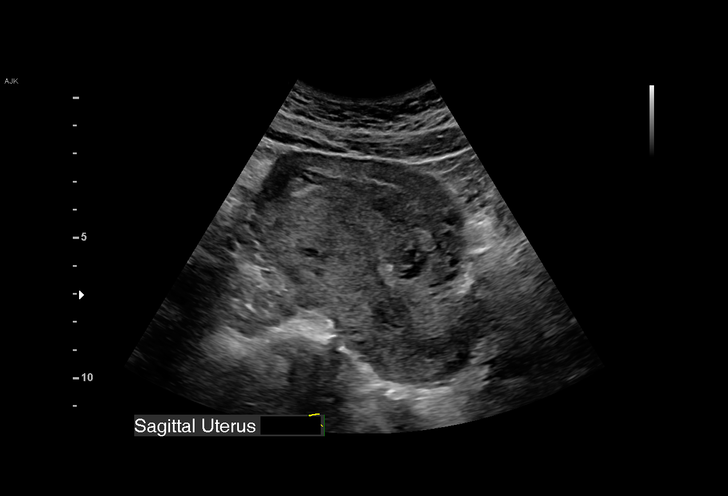
[im 3/41]
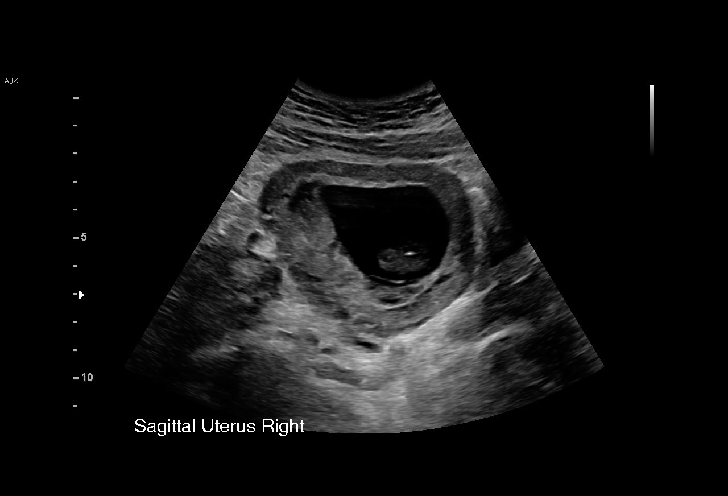
[im 6/41]
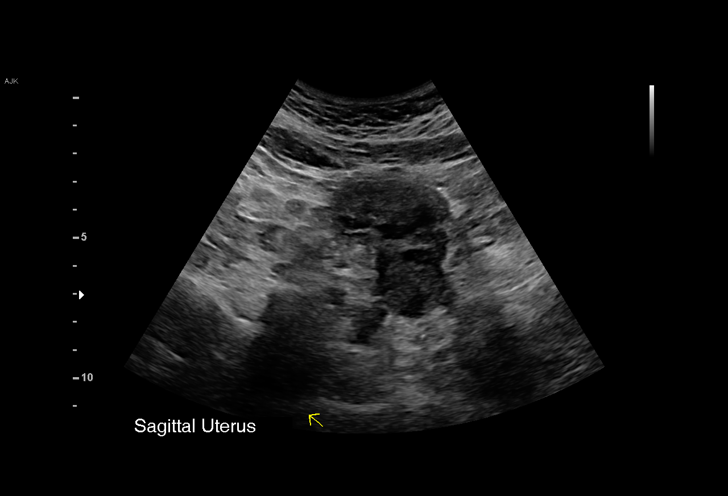
[im 9/41]
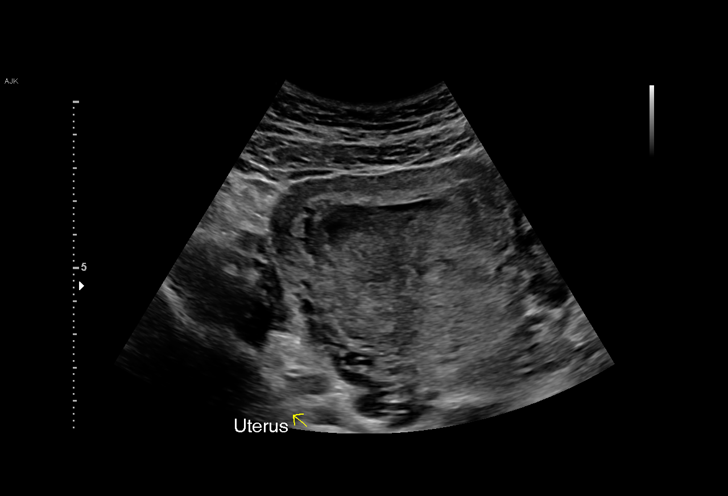
[im 12/41]
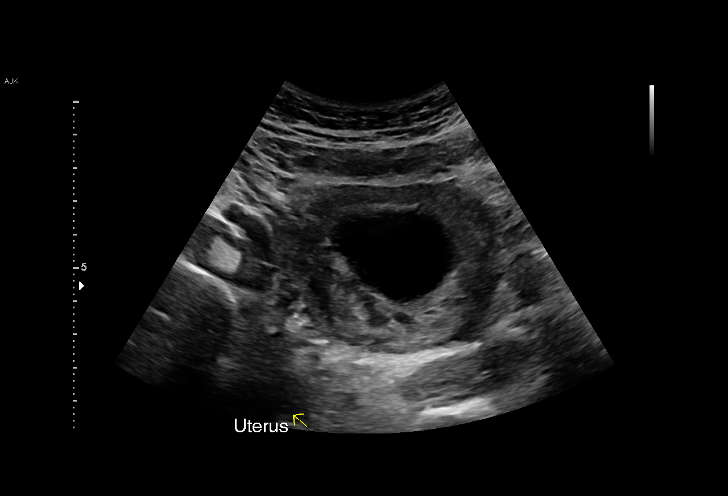
[im 15/41]
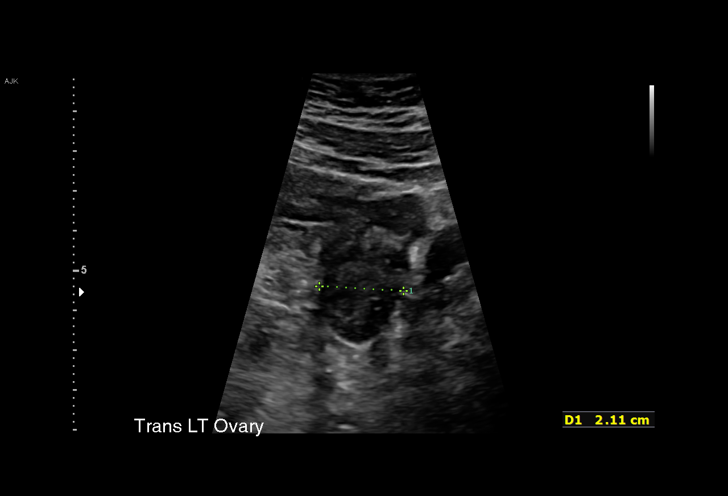
[im 18/41]
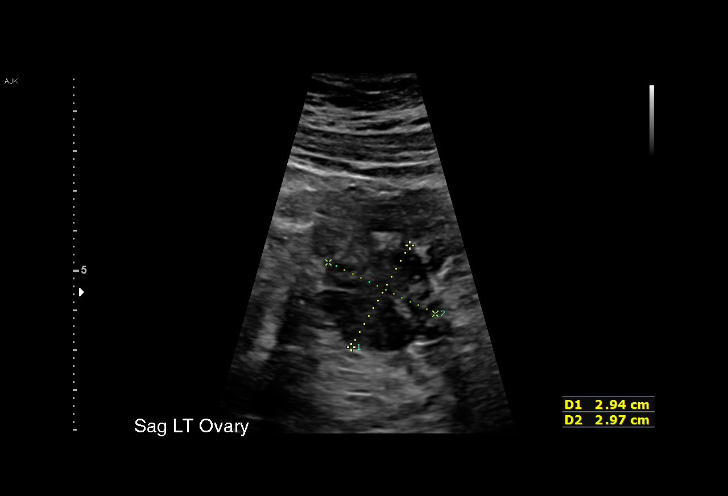
[im 21/41]
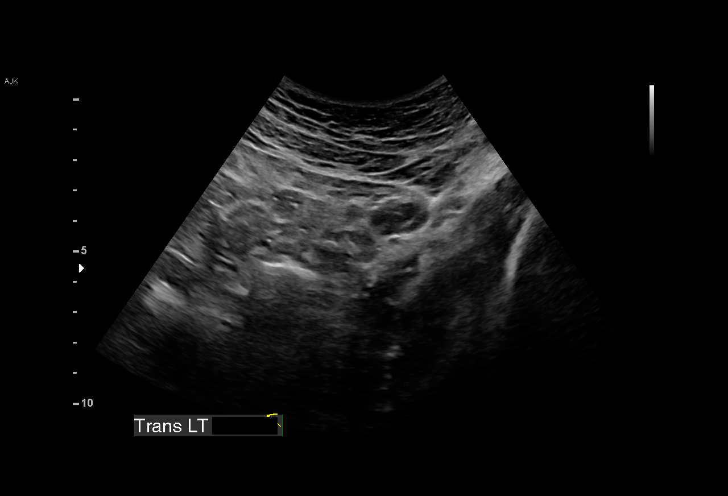
[im 23/41]
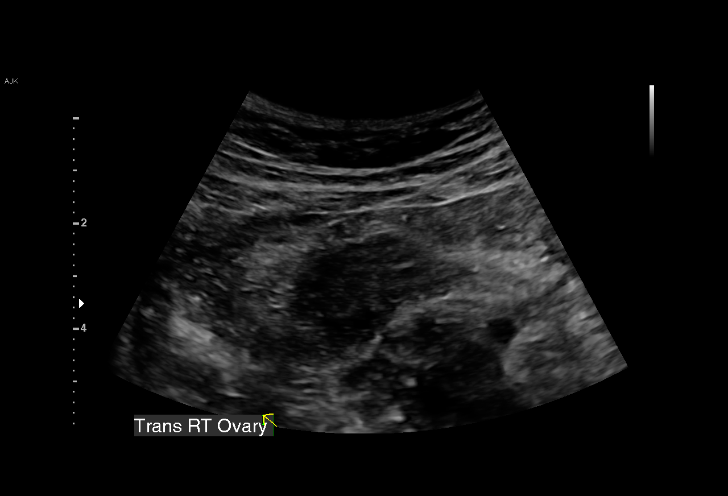
[im 26/41]
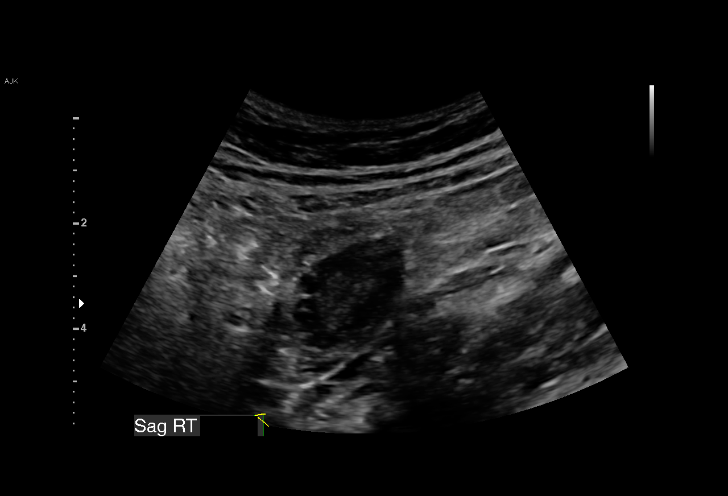
[im 29/41]
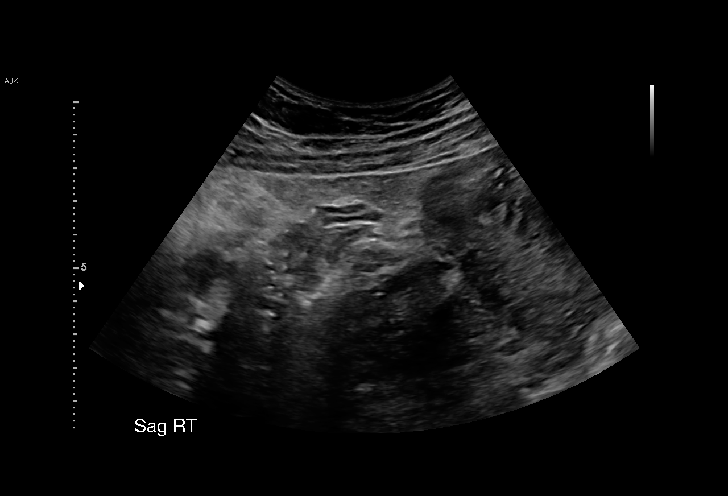
[im 32/41]
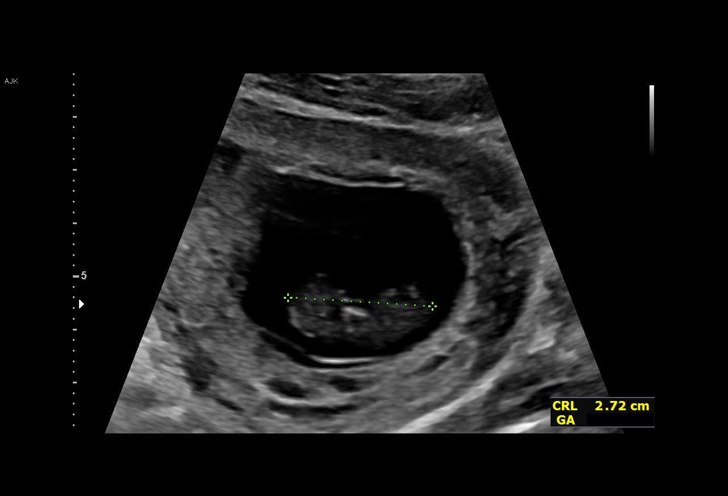
[im 35/41]
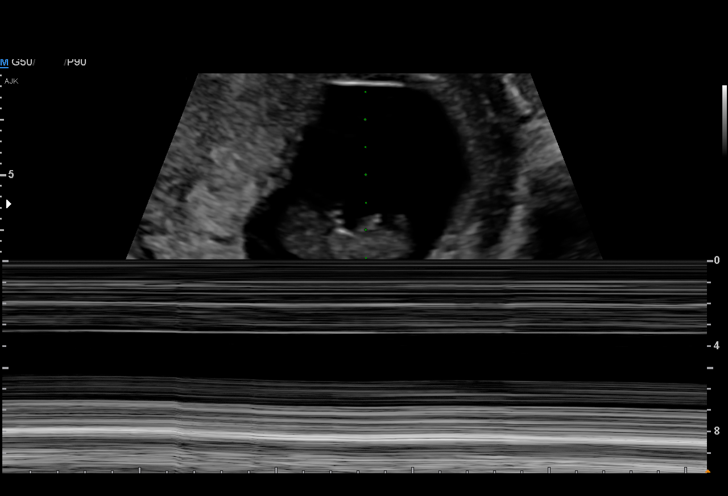
[im 38/41]
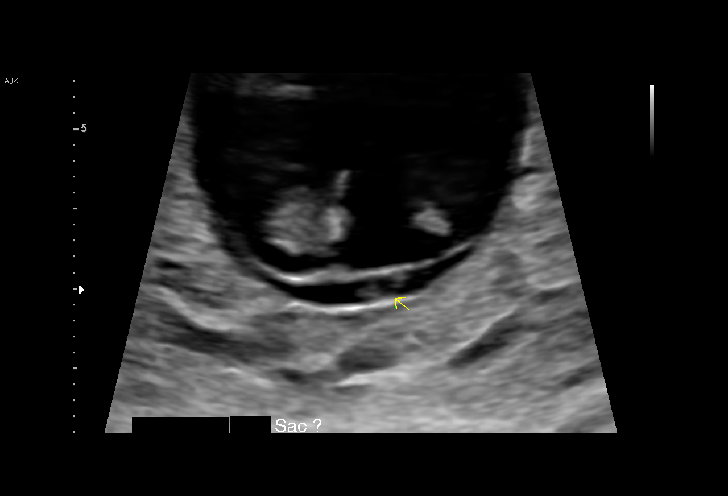
[im 41/41]
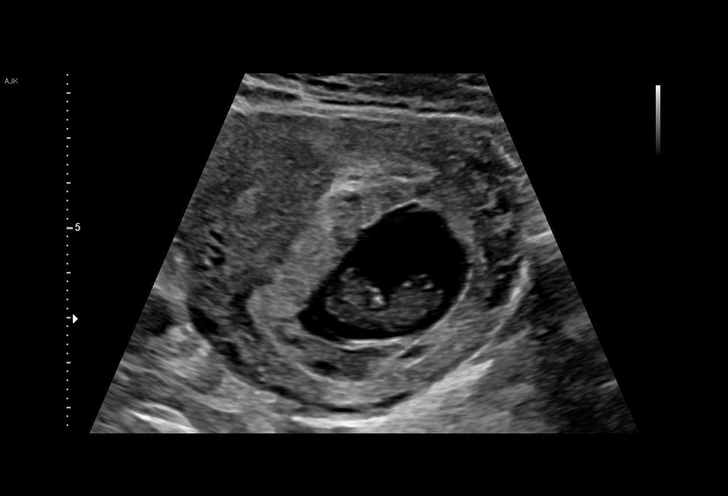

[15 of 28 positions shown; findings below may reference images not displayed]

FINDINGS: Intrauterine gestational sac: Present, single

Yolk sac: Present, single, abnormal however with irregular shape and
hypoechogenic, thickened wall.

Embryo:  Present, single

Cardiac Activity: Not identified

Heart Rate: 0 bpm

MSD: Appropriate given fetal size

CRL:   27 mm   9 w in 4 d                  US EDC: 02/13/2021

Subchorionic hemorrhage:  None visualized.

Maternal uterus/adnexae: The uterus is anteverted. No uterine masses
are identified. The cervix is closed and is unremarkable. No free
fluid within the cul-de-sac. The maternal ovaries are normal in size
and echogenicity.
IMPRESSION: Intrauterine fetal demise.

## 2023-11-15 ENCOUNTER — Encounter (HOSPITAL_COMMUNITY): Payer: Self-pay | Admitting: Obstetrics and Gynecology

## 2023-11-15 ENCOUNTER — Inpatient Hospital Stay (HOSPITAL_COMMUNITY)
Admission: AD | Admit: 2023-11-15 | Discharge: 2023-11-15 | Disposition: A | Discharge status: Home / Self Care | Attending: Obstetrics and Gynecology | Admitting: Obstetrics and Gynecology

## 2023-11-15 DIAGNOSIS — O26892 Other specified pregnancy related conditions, second trimester: Secondary | ICD-10-CM

## 2023-11-15 DIAGNOSIS — Z3A21 21 weeks gestation of pregnancy: Secondary | ICD-10-CM | POA: Diagnosis not present

## 2023-11-15 DIAGNOSIS — R1030 Lower abdominal pain, unspecified: Secondary | ICD-10-CM | POA: Diagnosis not present

## 2023-11-15 DIAGNOSIS — R102 Pelvic and perineal pain: Secondary | ICD-10-CM | POA: Insufficient documentation

## 2023-11-15 DIAGNOSIS — O26899 Other specified pregnancy related conditions, unspecified trimester: Secondary | ICD-10-CM

## 2023-11-15 LAB — URINALYSIS, ROUTINE W REFLEX MICROSCOPIC
Bilirubin Urine: NEGATIVE
Glucose, UA: NEGATIVE mg/dL
Hgb urine dipstick: NEGATIVE
Ketones, ur: NEGATIVE mg/dL
Leukocytes,Ua: NEGATIVE
Nitrite: NEGATIVE
Protein, ur: NEGATIVE mg/dL
Specific Gravity, Urine: 1.025 (ref 1.005–1.030)
pH: 6 (ref 5.0–8.0)

## 2023-11-15 LAB — WET PREP, GENITAL
Clue Cells Wet Prep HPF POC: NONE SEEN
Sperm: NONE SEEN
Trich, Wet Prep: NONE SEEN
WBC, Wet Prep HPF POC: <10 (ref <10)
Yeast Wet Prep HPF POC: NONE SEEN

## 2023-11-15 MED ORDER — ACETAMINOPHEN 325 MG PO TABS: 650.0000 mg | ORAL_TABLET | Freq: Four times a day (QID) | ORAL | 0 refills | Status: AC | PRN

## 2023-11-15 MED ORDER — CYCLOBENZAPRINE HCL 5 MG PO TABS
10.0000 mg | ORAL_TABLET | Freq: Once | ORAL | Status: AC
  Administered 2023-11-15: 10 mg via ORAL
  Filled 2023-11-15: qty 2

## 2023-11-15 MED ORDER — ACETAMINOPHEN 500 MG PO TABS
1000.0000 mg | ORAL_TABLET | Freq: Once | ORAL | Status: AC
Start: 2023-11-15 — End: 2023-11-15
  Administered 2023-11-15: 1000 mg via ORAL
  Filled 2023-11-15: qty 2

## 2023-11-15 MED ORDER — CYCLOBENZAPRINE HCL 10 MG PO TABS: 10.0000 mg | ORAL_TABLET | Freq: Two times a day (BID) | ORAL | 0 refills | Status: AC | PRN

## 2023-11-15 NOTE — MAU Provider Note (Signed)
 Chief Complaint:  Abdominal Pain   HPI    Joanna Parker is a 28 y.o. [id number] at [redacted]w[redacted]d who presents to maternity admissions reporting intermittent lower abdominal cramping. Reports it's worse with ambulating resolves with rest. Denies any urinary discomfort, vaginal burning or itching. No C/O N/V/D and reports no OB complaints at this time. No VB, LOF, and reports fetal movements.  Pregnancy Course: [facility]  ( Prenatal Records reviewed)  Past Medical History:  Diagnosis Date   Hypercholesteremia    Pregnancy induced hypertension    UTI (urinary tract infection)    OB History  Gravida Para Term Preterm AB Living  4 2 2  1 2   SAB IAB Ectopic Multiple Live Births      2    # Outcome Date GA Lbr Len/2nd Weight Sex Type Anes PTL Lv  4 Current           3 Term      Vag-Spont   LIV  2 Term      Vag-Spont   LIV  1 AB            Past Surgical History:  Procedure Laterality Date   DILATION AND EVACUATION N/A 07/22/2020   Procedure: DILATATION AND EVACUATION;  Surgeon: Raynell Caller, MD;  Location: [facility];  Service: Gynecology;  Laterality: N/A;   History reviewed. No pertinent family history. Social History   Tobacco Use   Smoking status: Never   Smokeless tobacco: Never  Vaping Use   Vaping status: Never Used  Substance Use Topics   Alcohol use: Not Currently   Drug use: Not Currently   No Known Allergies Medications Prior to Admission  Medication Sig Dispense Refill Last Dose/Taking   acetaminophen  (TYLENOL ) 325 MG tablet Take 2 tablets (650 mg total) by mouth every 6 (six) hours as needed for up to 30 doses for moderate pain or mild pain. 30 tablet 0 11/14/2023 Evening   Prenatal Vit-Fe Fumarate-FA (MULTIVITAMIN-PRENATAL) 27-0.8 MG TABS tablet Take 1 tablet by mouth daily at 12 noon.   11/14/2023 Evening   acetaminophen  (TYLENOL ) 500 MG tablet Take 500 mg by mouth every 6 (six) hours as needed.      cyclobenzaprine  (FLEXERIL ) 10 MG tablet Take 1 tablet (10 mg  total) by mouth 2 (two) times daily as needed for up to 15 doses for muscle spasms. 15 tablet 0    ibuprofen  (ADVIL ) 600 MG tablet Take 1 tablet (600 mg total) by mouth every 6 (six) hours as needed. 60 tablet 3    ibuprofen  (ADVIL ) 600 MG tablet Take 1 tablet (600 mg total) by mouth every 6 (six) hours as needed for up to 30 doses for moderate pain or mild pain. 30 tablet 0    Norethindrone Acetate-Ethinyl Estrad-FE (LOESTRIN 24 FE) 1-20 MG-MCG(24) tablet Take 1 tablet by mouth daily. 90 tablet 3     I have reviewed patient's Past Medical Hx, Surgical Hx, Family Hx, Social Hx, medications and allergies.   ROS  Pertinent items noted in HPI and remainder of comprehensive ROS otherwise negative.   PHYSICAL EXAM   Patient Vitals for the past 24 hrs:  BP Temp Temp src Pulse Resp SpO2 Height Weight  11/15/23 1608 108/61 98.6 F (37 C) Oral 94 17 100 % 5' (1.524 m) 67 kg    Constitutional: Well-developed, well-nourished female in no acute distress.  Cardiovascular: normal rate & rhythm, warm and well-perfused Respiratory: normal effort, no problems with respiration noted GI: Abd soft, non-tender  on palpation MS: Extremities nontender, no edema, normal ROM Neurologic: Alert and oriented x 4.  GU: no CVA tenderness Pelvic: Deferred       Fetal Tracing: 162 via Doppler    Labs: Results for orders placed or performed during the hospital encounter of 11/15/23 (from the past 24 hours)  Urinalysis, Routine w reflex microscopic -Urine, Clean Catch     Status: None   Collection Time: 11/15/23  4:15 PM  Result Value Ref Range   Color, Urine YELLOW YELLOW   APPearance CLEAR CLEAR   Specific Gravity, Urine 1.025 1.005 - 1.030   pH 6.0 5.0 - 8.0   Glucose, UA NEGATIVE NEGATIVE mg/dL   Hgb urine dipstick NEGATIVE NEGATIVE   Bilirubin Urine NEGATIVE NEGATIVE   Ketones, ur NEGATIVE NEGATIVE mg/dL   Protein, ur NEGATIVE NEGATIVE mg/dL   Nitrite NEGATIVE NEGATIVE   Leukocytes,Ua NEGATIVE  NEGATIVE  Wet prep, genital     Status: None   Collection Time: 11/15/23  4:45 PM  Result Value Ref Range   Yeast Wet Prep HPF POC NONE SEEN NONE SEEN   Trich, Wet Prep NONE SEEN NONE SEEN   Clue Cells Wet Prep HPF POC NONE SEEN NONE SEEN   WBC, Wet Prep HPF POC <10 <10   Sperm NONE SEEN     Imaging:  No results found.  MDM & MAU COURSE  MDM:  HIGH  Lower abdominal pain in pregnancy Benign abdominal exam  FHR via doppler 162 Wet Prep and U/A r/o infection ( No evidence of infection at this time) Tylenol  and Flexeril  given for pain with good relief Likely Round ligament pain Plan for discharge home with Flexeril  RX and advise Belly band for pelvic support    MAU Course: Orders Placed This Encounter  Procedures   Wet prep, genital   Urinalysis, Routine w reflex microscopic -Urine, Clean Catch   Discharge patient Discharge disposition: 01-Home or Self Care; Discharge patient date: 11/15/2023   Meds ordered this encounter  Medications   acetaminophen  (TYLENOL ) tablet 1,000 mg   cyclobenzaprine  (FLEXERIL ) tablet 10 mg   cyclobenzaprine  (FLEXERIL ) 10 MG tablet    Sig: Take 1 tablet (10 mg total) by mouth 2 (two) times daily as needed for muscle spasms.    Dispense:  20 tablet    Refill:  0    Supervising Provider:   PRATT, TANYA S [[id number]]   acetaminophen  (TYLENOL ) 325 MG tablet    Sig: Take 2 tablets (650 mg total) by mouth every 6 (six) hours as needed for mild pain (pain score 1-3).    Dispense:  30 tablet    Refill:  0    Supervising Provider:   PRATT, TANYA S [[id number]]     I have reviewed the patient chart and performed the physical exam . Medications ordered as stated below.  A/P as described below.  Counseling and education provided and patient agreeable  with plan as described below. Verbalized understanding.    ASSESSMENT   1. Lower abdominal pain   2. Pain of round ligament affecting pregnancy, antepartum   3. [redacted] weeks gestation of pregnancy     PLAN   Discharge home in stable condition with return precautions.   See AVS for full description of information given to the patient including both verbal and written. Patient verbalized understanding and agrees with the plan as described above.     Follow-up Information     [facility] Follow up.  Specialty: Obstetrics and Gynecology Why: If symptoms worsen or fail to resolve, As scheduled for ongoing prenatal care Contact information: [address] [place]  [address] [phone]                Allergies as of 11/15/2023   No Known Allergies      Medication List     STOP taking these medications    ibuprofen  600 MG tablet Commonly known as: ADVIL    Norethindrone Acetate-Ethinyl Estrad-FE 1-20 MG-MCG(24) tablet Commonly known as: LOESTRIN 24 FE       TAKE these medications    acetaminophen  325 MG tablet Commonly known as: Tylenol  Take 2 tablets (650 mg total) by mouth every 6 (six) hours as needed for mild pain (pain score 1-3). What changed:  medication strength how much to take reasons to take this Another medication with the same name was removed. Continue taking this medication, and follow the directions you see here.   cyclobenzaprine  10 MG tablet Commonly known as: FLEXERIL  Take 1 tablet (10 mg total) by mouth 2 (two) times daily as needed for muscle spasms.   multivitamin-prenatal 27-0.8 MG Tabs tablet Take 1 tablet by mouth daily at 12 noon.       Debbe Fail, MSN, [facility], [facility] [employer]

## 2023-11-15 NOTE — MAU Note (Signed)
 Joanna Parker is a 28 y.o. at [redacted]w[redacted]d here in MAU reporting: started having pain in lower abd yesterday.  Took some Tylenol  and a nap, went a way , but today it keeps coming back.  Starts in the middle, sharp pain, radiates to back.  No bleeding or LOF. reports lot of d/c, no odor or irritation.  Denies urinary or GI problems Onset of complaint: yesterday Pain score: 8 Vitals:   11/15/23 1608  BP: 108/61  Pulse: 94  Resp: 17  Temp: 98.6 F (37 C)  SpO2: 100%     FHT:162 Lab orders placed from triage:  urine

## 2023-11-16 LAB — GC/CHLAMYDIA PROBE AMP (~~LOC~~) NOT AT ARMC
Chlamydia: NEGATIVE
Comment: NEGATIVE
Comment: NORMAL
Neisseria Gonorrhea: NEGATIVE
